# Patient Record
Sex: Female | Born: 1965 | Race: White | Hispanic: No | Marital: Married | Smoking: Smoker, current status unknown
Health system: Southern US, Community
[De-identification: ages and names within clinical notes are randomized; demographics above are authoritative.]

## PROBLEM LIST (undated history)

## (undated) DIAGNOSIS — R000 Tachycardia, unspecified: Secondary | ICD-10-CM

## (undated) DIAGNOSIS — N938 Other specified abnormal uterine and vaginal bleeding: Secondary | ICD-10-CM

## (undated) HISTORY — PX: BREAST SURGERY: SHX581

## (undated) HISTORY — DX: Tachycardia, unspecified: R00.0

## (undated) HISTORY — DX: Other specified abnormal uterine and vaginal bleeding: N93.8

## (undated) HISTORY — PX: REDUCTION MAMMAPLASTY: SUR839

---

## 2001-01-01 ENCOUNTER — Other Ambulatory Visit: Admission: RE | Admit: 2001-01-01 | Discharge: 2001-01-01 | Payer: Self-pay | Admitting: Family Medicine

## 2002-09-24 ENCOUNTER — Other Ambulatory Visit: Admission: RE | Admit: 2002-09-24 | Discharge: 2002-09-24 | Payer: Self-pay | Admitting: Family Medicine

## 2004-07-14 ENCOUNTER — Ambulatory Visit: Payer: Self-pay | Admitting: Family Medicine

## 2004-07-20 ENCOUNTER — Ambulatory Visit: Payer: Self-pay | Admitting: Family Medicine

## 2004-07-20 ENCOUNTER — Other Ambulatory Visit: Admission: RE | Admit: 2004-07-20 | Discharge: 2004-07-20 | Payer: Self-pay | Admitting: Family Medicine

## 2005-11-21 ENCOUNTER — Ambulatory Visit: Payer: Self-pay | Admitting: Family Medicine

## 2005-11-30 ENCOUNTER — Other Ambulatory Visit: Admission: RE | Admit: 2005-11-30 | Discharge: 2005-11-30 | Payer: Self-pay | Admitting: Family Medicine

## 2005-11-30 ENCOUNTER — Encounter: Payer: Self-pay | Admitting: Family Medicine

## 2005-11-30 ENCOUNTER — Ambulatory Visit: Payer: Self-pay | Admitting: Family Medicine

## 2005-12-14 ENCOUNTER — Ambulatory Visit: Payer: Self-pay | Admitting: Family Medicine

## 2005-12-25 ENCOUNTER — Encounter: Admission: RE | Admit: 2005-12-25 | Discharge: 2005-12-25 | Payer: Self-pay | Admitting: Family Medicine

## 2005-12-27 ENCOUNTER — Ambulatory Visit: Payer: Self-pay | Admitting: Family Medicine

## 2006-01-31 ENCOUNTER — Ambulatory Visit: Payer: Self-pay | Admitting: Family Medicine

## 2007-02-19 ENCOUNTER — Encounter: Admission: RE | Admit: 2007-02-19 | Discharge: 2007-02-19 | Payer: Self-pay | Admitting: Family Medicine

## 2007-04-11 ENCOUNTER — Ambulatory Visit: Payer: Self-pay | Admitting: Family Medicine

## 2007-04-11 LAB — CONVERTED CEMR LAB
AST: 86 units/L — ABNORMAL HIGH (ref 0–37)
Alkaline Phosphatase: 52 units/L (ref 39–117)
Basophils Relative: 0.2 % (ref 0.0–1.0)
Bilirubin Urine: NEGATIVE
Bilirubin, Direct: 0.1 mg/dL (ref 0.0–0.3)
Chloride: 105 meq/L (ref 96–112)
Cholesterol: 235 mg/dL (ref 0–200)
Creatinine, Ser: 0.7 mg/dL (ref 0.4–1.2)
Eosinophils Absolute: 0.2 10*3/uL (ref 0.0–0.6)
Glucose, Bld: 98 mg/dL (ref 70–99)
HCT: 36.5 % (ref 36.0–46.0)
Ketones, urine, test strip: NEGATIVE
Lymphocytes Relative: 33.4 % (ref 12.0–46.0)
MCHC: 34.8 g/dL (ref 30.0–36.0)
Neutro Abs: 3.2 10*3/uL (ref 1.4–7.7)
Neutrophils Relative %: 57.1 % (ref 43.0–77.0)
Platelets: 346 10*3/uL (ref 150–400)
Protein, U semiquant: NEGATIVE
RBC: 4.1 M/uL (ref 3.87–5.11)
RDW: 12 % (ref 11.5–14.6)
TSH: 2.03 microintl units/mL (ref 0.35–5.50)
Total Bilirubin: 0.5 mg/dL (ref 0.3–1.2)
Total Protein: 7 g/dL (ref 6.0–8.3)
Urobilinogen, UA: 0.2

## 2007-04-18 ENCOUNTER — Ambulatory Visit: Payer: Self-pay | Admitting: Family Medicine

## 2007-04-18 ENCOUNTER — Other Ambulatory Visit: Admission: RE | Admit: 2007-04-18 | Discharge: 2007-04-18 | Payer: Self-pay | Admitting: Family Medicine

## 2007-04-18 ENCOUNTER — Encounter: Payer: Self-pay | Admitting: Family Medicine

## 2007-04-18 DIAGNOSIS — N938 Other specified abnormal uterine and vaginal bleeding: Secondary | ICD-10-CM | POA: Insufficient documentation

## 2007-04-18 DIAGNOSIS — N949 Unspecified condition associated with female genital organs and menstrual cycle: Secondary | ICD-10-CM

## 2007-04-18 DIAGNOSIS — I472 Ventricular tachycardia, unspecified: Secondary | ICD-10-CM | POA: Insufficient documentation

## 2007-04-18 DIAGNOSIS — I4729 Other ventricular tachycardia: Secondary | ICD-10-CM | POA: Insufficient documentation

## 2007-04-18 DIAGNOSIS — H908 Mixed conductive and sensorineural hearing loss, unspecified: Secondary | ICD-10-CM | POA: Insufficient documentation

## 2007-04-30 ENCOUNTER — Encounter: Payer: Self-pay | Admitting: Family Medicine

## 2008-08-03 ENCOUNTER — Encounter: Admission: RE | Admit: 2008-08-03 | Discharge: 2008-08-03 | Payer: Self-pay | Admitting: Family Medicine

## 2009-09-20 ENCOUNTER — Ambulatory Visit: Payer: Self-pay | Admitting: Family Medicine

## 2009-09-20 LAB — CONVERTED CEMR LAB
ALT: 10 units/L (ref 0–35)
AST: 61 units/L — ABNORMAL HIGH (ref 0–37)
Alkaline Phosphatase: 57 units/L (ref 39–117)
Basophils Absolute: 0 10*3/uL (ref 0.0–0.1)
Basophils Relative: 0.5 % (ref 0.0–3.0)
Bilirubin, Direct: 0 mg/dL (ref 0.0–0.3)
CO2: 27 meq/L (ref 19–32)
Cholesterol: 214 mg/dL — ABNORMAL HIGH (ref 0–200)
Creatinine, Ser: 0.6 mg/dL (ref 0.4–1.2)
GFR calc non Af Amer: 112.09 mL/min (ref >60)
HCT: 38.4 % (ref 36.0–46.0)
Hemoglobin: 13.1 g/dL (ref 12.0–15.0)
Ketones, urine, test strip: NEGATIVE
Lymphocytes Relative: 29.1 % (ref 12.0–46.0)
Lymphs Abs: 1.9 10*3/uL (ref 0.7–4.0)
MCV: 91.2 fL (ref 78.0–100.0)
Monocytes Absolute: 0.5 10*3/uL (ref 0.1–1.0)
Neutro Abs: 4.1 10*3/uL (ref 1.4–7.7)
Neutrophils Relative %: 61.4 % (ref 43.0–77.0)
Nitrite: NEGATIVE
Sodium: 143 meq/L (ref 135–145)
Specific Gravity, Urine: 1.025
Total Bilirubin: 0.3 mg/dL (ref 0.3–1.2)
Total CHOL/HDL Ratio: 3
Triglycerides: 42 mg/dL (ref 0.0–149.0)
WBC: 6.6 10*3/uL (ref 4.5–10.5)
pH: 7

## 2009-09-30 ENCOUNTER — Other Ambulatory Visit: Admission: RE | Admit: 2009-09-30 | Discharge: 2009-09-30 | Payer: Self-pay | Admitting: Family Medicine

## 2009-09-30 ENCOUNTER — Ambulatory Visit: Payer: Self-pay | Admitting: Family Medicine

## 2009-09-30 DIAGNOSIS — I499 Cardiac arrhythmia, unspecified: Secondary | ICD-10-CM | POA: Insufficient documentation

## 2009-09-30 DIAGNOSIS — N390 Urinary tract infection, site not specified: Secondary | ICD-10-CM | POA: Insufficient documentation

## 2009-09-30 LAB — CONVERTED CEMR LAB: Pap Smear: NEGATIVE

## 2009-10-06 ENCOUNTER — Telehealth: Payer: Self-pay | Admitting: Family Medicine

## 2009-10-10 ENCOUNTER — Ambulatory Visit: Payer: Self-pay | Admitting: Internal Medicine

## 2009-11-23 ENCOUNTER — Telehealth: Payer: Self-pay | Admitting: Family Medicine

## 2009-12-01 ENCOUNTER — Ambulatory Visit: Payer: Self-pay | Admitting: Family Medicine

## 2009-12-01 DIAGNOSIS — I4949 Other premature depolarization: Secondary | ICD-10-CM | POA: Insufficient documentation

## 2010-02-28 ENCOUNTER — Ambulatory Visit: Payer: Self-pay | Admitting: Family Medicine

## 2010-02-28 DIAGNOSIS — J45909 Unspecified asthma, uncomplicated: Secondary | ICD-10-CM | POA: Insufficient documentation

## 2010-05-30 NOTE — Progress Notes (Signed)
Summary: PRE-AUTHORIZATION FOR MONITOR?  Phone Note Call from Patient   Caller: Patient  [phone] Reason for Call: Talk to Nurse Summary of Call: Pt called to speak with Fleet Contras, CMA... Marland KitchenPt adv that she was referred by Dr Tawanna Cooler to a Cardiologist and has been told that she will need to wear a holter-monitor for one week - ten days.... Pt states that [employer] told her that [facility] will be in charge of taking care of the pre-authorization  for the monitor through the pts insurance ([employer] through [facility])?   Pt wanted to make sure this is taken care of prior to her appt with Cardiology on October 10, 2009 at 9am.  Pt can be reached at [phone] with any questions or concerns.  Initial call taken by: Debbra Riding,  October 06, 2009 11:18 AM  Follow-up for Phone Call        I called pt's insurance: [employer] and they said that pre-cert is not required.  Faxed order to [facility] again to set up.  Follow-up by: Corky Mull,  October 06, 2009 2:27 PM

## 2010-05-30 NOTE — Procedures (Signed)
Summary: summary report  summary report   Imported By: Mirna Mires 11/14/2009 10:12:44  _____________________________________________________________________  External Attachment:    Type:   Image     Comment:   External Document

## 2010-05-30 NOTE — Assessment & Plan Note (Signed)
Summary: fu from cardiologist/holter monitor/njr   Vital Signs:  Patient profile:   45 year old female Menstrual status:  regular Weight:      165 pounds Temp:     99.1 degrees F oral BP sitting:   130 / 90  (left arm) Cuff size:   regular  Vitals Entered By: Kern Reap CMA Duncan Dull) (December 01, 2009 11:14 AM) CC: follow-up visit   CC:  follow-up visit.  History of Present Illness: Rachel Phillips is a 45 year old female, who comes in today for evaluation of cardiac arrhythmia.  She's been having episodes of skipping.  EKG was normal.  Holter monitor shows infrequent PVCs.  They actually look like PACs, but Dr. Graciela Husbands thinks there PVCs, but do not paired and she has no neurologic symptoms.  We discussed for his options, including going on a caffeine free diet, however, do not bother her that much, so she is content to do nothing at this juncture.  If, however, it gets worse.  They become more frequent and she is to try the caffeine free diet.  If that does not work then we will consider other medications.  Also, it she was supposed to call in May for her mammogram and has not done so.  Encouraged to call today to get set up for a screening mammogram  Allergies: No Known Drug Allergies  Review of Systems      See HPI  Physical Exam  General:  Well-developed,well-nourished,in no acute distress; alert,appropriate and cooperative throughout examination Heart:  Normal rate and regular rhythm. S1 and S2 normal without gallop, murmur, click, rub or other extra sounds.   Impression & Recommendations:  Problem # 1:  PREMATURE VENTRICULAR CONTRACTIONS (ICD-427.69) Assessment New  Her updated medication list for this problem includes:    Adult Aspirin Low Strength 81 Mg Tbdp (Aspirin) ..... Once daily  Complete Medication List: 1)  Verapamil Hcl Cr 240 Mg Cp24 (Verapamil hcl) .... Take 1 capsule by mouth once a day 2)  Adult Aspirin Low Strength 81 Mg Tbdp (Aspirin) .... Once daily 3)   Fexofenadine Hcl 180 Mg Tabs (Fexofenadine hcl) .... Once daily 4)  Norinyl 1+35 (28) 1-35 Mg-mcg Tabs (Norethindrone-eth estradiol) .... Once daily 5)  Phisohex 3 % Liqd (Hexachlorophene) .... As needed 6)  Verelan 240 Mg Xr24h-cap (Verapamil hcl) .Marland Kitchen.. 1 a day 7)  Septra Ds 800-160 Mg Tabs (Sulfamethoxazole-trimethoprim) .... 2 by mouth two times a day x 10 days 8)  Keflex 500 Mg Caps (Cephalexin) .... 2 by mouth two times a day as needed boils  Patient Instructions: 1)  is the PVCs are bothersome, go on a complete caffeine free diet.  If, however, the caffeine free diet, does not stop the skipping or it gets worse or he develops symptoms of lightheadedness, etc., then returned for follow-up

## 2010-05-30 NOTE — Assessment & Plan Note (Signed)
Summary: ST/CHEST CONGESTION/NJR   Vital Signs:  Patient profile:   45 year old female Menstrual status:  regular Weight:      169 pounds BMI:     28.22 O2 Sat:      99 % on Room air Temp:     98.0 degrees F oral Pulse rate:   94 / minute Pulse rhythm:   irregular Resp:     18 per minute BP sitting:   128 / 70  (right arm)  O2 Flow:  Room air CC: pt c/o sore throat, deep chest cough, chest congestion x 8 days   CC:  pt c/o sore throat, deep chest cough, and chest congestion x 8 days.  History of Present Illness: Rachel Phillips is a 45 year old female, nonsmoker, who comes in today for evaluation of a cough x 10 days.  She developed a cough 10 days ago.  No history of any viral symptoms.  She has had a history of allergic rhinitis in the past  Allergies (verified): 1)  Codeine  Past History:  Past medical, surgical, family and social histories (including risk factors) reviewed for relevance to current acute and chronic problems.  Past Medical History: Reviewed history from 01/13/2007 and no changes required. Tachycardia DUB Hearing Loss  Past Surgical History: Reviewed history from 01/13/2007 and no changes required. Bil Breast Reduction  Family History: Reviewed history from 01/13/2007 and no changes required. Family History of CAD Female 1st degree relative <50  Social History: Reviewed history from 01/13/2007 and no changes required. Occupation: Former Smoker Alcohol use-yes Drug use-no Married  Review of Systems      See HPI  Physical Exam  General:  Well-developed,well-nourished,in no acute distress; alert,appropriate and cooperative throughout examination Head:  Normocephalic and atraumatic without obvious abnormalities. No apparent alopecia or balding. Eyes:  No corneal or conjunctival inflammation noted. EOMI. Perrla. Funduscopic exam benign, without hemorrhages, exudates or papilledema. Vision grossly normal. Ears:  External ear exam shows no significant  lesions or deformities.  Otoscopic examination reveals clear canals, tympanic membranes are intact bilaterally without bulging, retraction, inflammation or discharge. Hearing is grossly normal bilaterally. Nose:  External nasal examination shows no deformity or inflammation. Nasal mucosa are pink and moist without lesions or exudates. Mouth:  Oral mucosa and oropharynx without lesions or exudates.  Teeth in good repair. Neck:  No deformities, masses, or tenderness noted. Chest Wall:  No deformities, masses, or tenderness noted. Lungs:  symmetrical breath sounds, late expiratory wheezing bilaterally   Problems:  Medical Problems Added: 1)  Dx of Asthma  (ICD-493.90)  Impression & Recommendations:  Problem # 1:  ASTHMA (ICD-493.90) Assessment New  Her updated medication list for this problem includes:    Prednisone 20 Mg Tabs (Prednisone) ..... Uad  Complete Medication List: 1)  Verapamil Hcl Cr 240 Mg Cp24 (Verapamil hcl) .... Take 1 capsule by mouth once a day 2)  Adult Aspirin Low Strength 81 Mg Tbdp (Aspirin) .... Once daily 3)  Fexofenadine Hcl 180 Mg Tabs (Fexofenadine hcl) .... Once daily 4)  Norinyl 1+35 (28) 1-35 Mg-mcg Tabs (Norethindrone-eth estradiol) .... Once daily 5)  Phisohex 3 % Liqd (Hexachlorophene) .... As needed 6)  Prednisone 20 Mg Tabs (Prednisone) .... Uad 7)  Hydromet 5-1.5 Mg/65ml Syrp (Hydrocodone-homatropine) .... 1/2 to 1 tsp at bedtime as needed  Patient Instructions: 1)  30 ounces of water daily. 2)  Hydromet  one half to 1 teaspoon at bedtime as needed for nighttime cough. 3)  Prednisone two tabs x 3  days, one x 3 days, a half x 3 days, then half a tablet Monday, Wednesday, Friday, for a two week taper, and return p.r.n. Prescriptions: HYDROMET 5-1.5 MG/5ML SYRP (HYDROCODONE-HOMATROPINE) 1/2 to 1 tsp at bedtime as needed  #4ox x 1   Entered and Authorized by:   Roderick Pee MD   Signed by:   Roderick Pee MD on 02/28/2010   Method used:   Printed  then faxed to ...       [facility]* (retail)       [address]       [place], [place]  [id number]       Ph: [phone]       Fax: [phone]   RxID:   [id number] PREDNISONE 20 MG TABS (PREDNISONE) UAD  #30 x 1   Entered and Authorized by:   Roderick Pee MD   Signed by:   Roderick Pee MD on 02/28/2010   Method used:   Electronically to        [facility]* (retail)       [address]       [place], [place]  [id number]       Ph: [phone]       Fax: [phone]   RxID:   [id number]    Orders Added: 1)  Est. Patient Level III [30160]

## 2010-05-30 NOTE — Progress Notes (Signed)
Summary: Holter results.  Phone Note Call from Patient   Caller: Patient Call For: Roderick Pee MD Summary of Call: Pt is calling to ask about a holter monitor result. [phone] Initial call taken by: Lynann Beaver CMA,  November 23, 2009 3:43 PM  Follow-up for Phone Call        Fleet Contras please call cardiology and have them fax as a report on her hold her Holter monitor.  Summary report Follow-up by: Roderick Pee MD,  November 28, 2009 8:09 AM  Additional Follow-up for Phone Call Additional follow up Details #1::        left message on machine for patient to schedule an office visit Additional Follow-up by: Kern Reap CMA Duncan Dull),  November 29, 2009 4:37 PM

## 2010-05-30 NOTE — Assessment & Plan Note (Signed)
Summary: CPX--PAP//CCM--will come at 3:15 per rachel//ccm   Vital Signs:  Patient profile:   45 year old female Menstrual status:  regular LMP:     09/19/2009 Height:      65 inches Weight:      167 pounds BMI:     27.89 Temp:     98.5 degrees F oral BP sitting:   120 / 80  (right arm)  Vitals Entered By: Kathrynn Speed CMA (September 30, 2009 3:14 PM) CC: cpx /  LMP (date): 09/19/2009     Menstrual Status regular Enter LMP: 09/19/2009   CC:  cpx / .  History of Present Illness: Rachel Phillips is a 45 year old female, married, nonsmoker............ she has retained her maiden name......... who comes in today for general physical examination and to talk about her palpitations.  Her overall health has been good.  She is on verapamil 240 mg daily because of the history of rapid heart rate.  She's been on verapamil now for 13 years however recently she is having more episodes of palpitations.  She denies any chest pain, shortness of breath, etc..  Review of systems negative  she's also noticed over the past couple weeks.  Her urine dark color, and frequency, although no burning.  Routine UA shows, red cells, and white cells, consistent with cystitis.  She also has boils that occur around her groin periodically.  It's worse during the summer, when it gets hot  Current Medications (verified): 1)  Verelan 240 Mg Xr24h-Cap (Verapamil Hcl) .Marland Kitchen.. 1 A Day  Allergies (verified): No Known Drug Allergies  Review of Systems      See HPI  Physical Exam  General:  Well-developed,well-nourished,in no acute distress; alert,appropriate and cooperative throughout examination Head:  Normocephalic and atraumatic without obvious abnormalities. No apparent alopecia or balding. Eyes:  No corneal or conjunctival inflammation noted. EOMI. Perrla. Funduscopic exam benign, without hemorrhages, exudates or papilledema. Vision grossly normal. Ears:  External ear exam shows no significant lesions or deformities.   Otoscopic examination reveals clear canals, tympanic membranes are intact bilaterally without bulging, retraction, inflammation or discharge. Hearing is grossly normal bilaterally. Nose:  External nasal examination shows no deformity or inflammation. Nasal mucosa are pink and moist without lesions or exudates. Mouth:  Oral mucosa and oropharynx without lesions or exudates.  Teeth in good repair. Neck:  No deformities, masses, or tenderness noted. Chest Wall:  No deformities, masses, or tenderness noted. Breasts:  scars from reduction mammoplasty, otherwise, normal exam Lungs:  Normal respiratory effort, chest expands symmetrically. Lungs are clear to auscultation, no crackles or wheezes. Heart:  Normal rate and regular rhythm. S1 and S2 normal without gallop, murmur, click, rub or other extra sounds. Abdomen:  Bowel sounds positive,abdomen soft and non-tender without masses, organomegaly or hernias noted. Rectal:  No external abnormalities noted. Normal sphincter tone. No rectal masses or tenderness. Genitalia:  Pelvic Exam:        External: normal female genitalia without lesions or masses        Vagina: normal without lesions or masses        Cervix: normal without lesions or masses        Adnexa: normal bimanual exam without masses or fullness        Uterus: normal by palpation        Pap smear: performed Msk:  No deformity or scoliosis noted of thoracic or lumbar spine.   Pulses:  R and L carotid,radial,femoral,dorsalis pedis and posterior tibial pulses are full and  equal bilaterally Extremities:  No clubbing, cyanosis, edema, or deformity noted with normal full range of motion of all joints.   Neurologic:  No cranial nerve deficits noted. Station and gait are normal. Plantar reflexes are down-going bilaterally. DTRs are symmetrical throughout. Sensory, motor and coordinative functions appear intact. Skin:  Intact without suspicious lesions or rashes Cervical Nodes:  No lymphadenopathy  noted Axillary Nodes:  No palpable lymphadenopathy Inguinal Nodes:  No significant adenopathy Psych:  Cognition and judgment appear intact. Alert and cooperative with normal attention span and concentration. No apparent delusions, illusions, hallucinations   Problems:  Medical Problems Added: 1)  Dx of Uti  (ICD-599.0) 2)  Dx of Routine General Medical Exam@[facility]  (ICD-V70.0) 3)  Dx of Cardiac Arrhythmia  (ICD-427.9)  Impression & Recommendations:  Problem # 1:  Preventive Health Care (ICD-V70.0) Assessment Unchanged  Problem # 2:  CARDIAC ARRHYTHMIA (ICD-427.9) Assessment: Deteriorated  Orders: Cardiology Referral (Cardiology) Prescription Created Electronically [phone]) EKG w/ Interpretation (93000)  Problem # 3:  UTI (ICD-599.0) Assessment: New  Her updated medication list for this problem includes:    Septra Ds 800-160 Mg Tabs (Sulfamethoxazole-trimethoprim) .Marland Kitchen... 2 by mouth two times a day x 10 days    Keflex 500 Mg Caps (Cephalexin) .Marland Kitchen... 2 by mouth two times a day as needed boils  Complete Medication List: 1)  Verelan 240 Mg Xr24h-cap (Verapamil hcl) .Marland Kitchen.. 1 a day 2)  Septra Ds 800-160 Mg Tabs (Sulfamethoxazole-trimethoprim) .... 2 by mouth two times a day x 10 days 3)  Keflex 500 Mg Caps (Cephalexin) .... 2 by mouth two times a day as needed boils  Patient Instructions: 1)  continue your current medications.  We will get she set up for an event monitor because of the episodes of rapid heart rate 2)  Please schedule a follow-up appointment in 1 year. 3)  Schedule your mammogram. 4)  Take an Aspirin every day. 5)  take Septra DS twice a day for 10 days.  Three.  UTI.  Also Keflex two tabs b.i.d. for a day or two when you have a flare of the boils Prescriptions: KEFLEX 500 MG CAPS (CEPHALEXIN) 2 by mouth two times a day as needed boils  #50 x 3   Entered and Authorized by:   Roderick Pee MD   Signed by:   Roderick Pee MD on 09/30/2009   Method used:    Electronically to        [facility]* (retail)       [address]       [place], [place]  [id number]       Ph: [phone]       Fax: [phone]   RxID:   [id number] SEPTRA DS 800-160 MG TABS (SULFAMETHOXAZOLE-TRIMETHOPRIM) 2 by mouth two times a day x 10 days  #20 x 0   Entered and Authorized by:   Roderick Pee MD   Signed by:   Roderick Pee MD on 09/30/2009   Method used:   Electronically to        [facility]* (retail)       [address]       [place], [place]  [id number]       Ph: [phone]       Fax: [phone]   RxID:   [id number] VERELAN 240 MG XR24H-CAP (VERAPAMIL HCL) 1 a day  #100 x 3   Entered and Authorized by:   Roderick Pee MD  Signed by:   Roderick Pee MD on 09/30/2009   Method used:   Electronically to        [facility]* (retail)       [address]       [place], [place]  [id number]       Ph: [phone]       Fax: [phone]   RxID:   [id number]

## 2011-03-16 ENCOUNTER — Other Ambulatory Visit: Payer: Self-pay | Admitting: Family Medicine

## 2011-03-16 DIAGNOSIS — Z1231 Encounter for screening mammogram for malignant neoplasm of breast: Secondary | ICD-10-CM

## 2011-04-12 ENCOUNTER — Ambulatory Visit
Admission: RE | Admit: 2011-04-12 | Discharge: 2011-04-12 | Disposition: A | Discharge status: Home / Self Care | Payer: PRIVATE HEALTH INSURANCE | Source: Ambulatory Visit | Attending: Family Medicine | Admitting: Family Medicine

## 2011-04-12 DIAGNOSIS — Z1231 Encounter for screening mammogram for malignant neoplasm of breast: Secondary | ICD-10-CM

## 2011-04-17 ENCOUNTER — Other Ambulatory Visit (INDEPENDENT_AMBULATORY_CARE_PROVIDER_SITE_OTHER): Payer: PRIVATE HEALTH INSURANCE

## 2011-04-17 DIAGNOSIS — Z0000 Encounter for general adult medical examination without abnormal findings: Secondary | ICD-10-CM

## 2011-04-17 LAB — BASIC METABOLIC PANEL WITH GFR
BUN: 13 mg/dL (ref 6–23)
CO2: 28 meq/L (ref 19–32)
Calcium: 9.5 mg/dL (ref 8.4–10.5)
Chloride: 107 meq/L (ref 96–112)
Creatinine, Ser: 0.8 mg/dL (ref 0.4–1.2)
GFR: 83.84 mL/min
Glucose, Bld: 90 mg/dL (ref 70–99)
Potassium: 4.5 meq/L (ref 3.5–5.1)
Sodium: 142 meq/L (ref 135–145)

## 2011-04-17 LAB — CBC WITH DIFFERENTIAL/PLATELET
Eosinophils Absolute: 0.1 10*3/uL (ref 0.0–0.7)
HCT: 35.9 % — ABNORMAL LOW (ref 36.0–46.0)
MCHC: 34.2 g/dL (ref 30.0–36.0)
MCV: 89.6 fl (ref 78.0–100.0)
Monocytes Absolute: 0.4 10*3/uL (ref 0.1–1.0)
Neutro Abs: 3.7 10*3/uL (ref 1.4–7.7)
RBC: 4 Mil/uL (ref 3.87–5.11)
WBC: 6.2 10*3/uL (ref 4.5–10.5)

## 2011-04-17 LAB — POCT URINALYSIS DIPSTICK
Bilirubin, UA: NEGATIVE
Blood, UA: NEGATIVE
Glucose, UA: NEGATIVE
Nitrite, UA: NEGATIVE
Spec Grav, UA: 1.025
Urobilinogen, UA: 0.2
pH, UA: 6.5

## 2011-04-17 LAB — HEPATIC FUNCTION PANEL
ALT: 17 U/L (ref 0–35)
AST: 73 U/L — ABNORMAL HIGH (ref 0–37)
Albumin: 4.1 g/dL (ref 3.5–5.2)
Alkaline Phosphatase: 61 U/L (ref 39–117)
Bilirubin, Direct: 0 mg/dL (ref 0.0–0.3)
Total Bilirubin: 0.4 mg/dL (ref 0.3–1.2)

## 2011-04-17 LAB — LIPID PANEL
Cholesterol: 231 mg/dL — ABNORMAL HIGH (ref 0–200)
HDL: 92.8 mg/dL
Total CHOL/HDL Ratio: 2
Triglycerides: 61 mg/dL (ref 0.0–149.0)
VLDL: 12.2 mg/dL (ref 0.0–40.0)

## 2011-04-17 LAB — LDL CHOLESTEROL, DIRECT: Direct LDL: 121.8 mg/dL

## 2011-04-17 LAB — TSH: TSH: 1.18 u[IU]/mL (ref 0.35–5.50)

## 2011-04-27 ENCOUNTER — Encounter: Payer: Self-pay | Admitting: Family Medicine

## 2011-04-30 ENCOUNTER — Ambulatory Visit (INDEPENDENT_AMBULATORY_CARE_PROVIDER_SITE_OTHER): Payer: PRIVATE HEALTH INSURANCE | Admitting: Family Medicine

## 2011-04-30 ENCOUNTER — Other Ambulatory Visit (HOSPITAL_COMMUNITY)
Admission: RE | Admit: 2011-04-30 | Discharge: 2011-04-30 | Disposition: A | Discharge status: Home / Self Care | Payer: PRIVATE HEALTH INSURANCE | Source: Ambulatory Visit | Attending: Family Medicine | Admitting: Family Medicine

## 2011-04-30 ENCOUNTER — Encounter: Payer: Self-pay | Admitting: Family Medicine

## 2011-04-30 DIAGNOSIS — J069 Acute upper respiratory infection, unspecified: Secondary | ICD-10-CM

## 2011-04-30 DIAGNOSIS — M25519 Pain in unspecified shoulder: Secondary | ICD-10-CM

## 2011-04-30 DIAGNOSIS — H908 Mixed conductive and sensorineural hearing loss, unspecified: Secondary | ICD-10-CM

## 2011-04-30 DIAGNOSIS — Z0000 Encounter for general adult medical examination without abnormal findings: Secondary | ICD-10-CM

## 2011-04-30 DIAGNOSIS — I4949 Other premature depolarization: Secondary | ICD-10-CM

## 2011-04-30 DIAGNOSIS — M25512 Pain in left shoulder: Secondary | ICD-10-CM

## 2011-04-30 DIAGNOSIS — I499 Cardiac arrhythmia, unspecified: Secondary | ICD-10-CM

## 2011-04-30 DIAGNOSIS — I4729 Other ventricular tachycardia: Secondary | ICD-10-CM

## 2011-04-30 DIAGNOSIS — N951 Menopausal and female climacteric states: Secondary | ICD-10-CM

## 2011-04-30 DIAGNOSIS — N959 Unspecified menopausal and perimenopausal disorder: Secondary | ICD-10-CM | POA: Insufficient documentation

## 2011-04-30 DIAGNOSIS — Z01419 Encounter for gynecological examination (general) (routine) without abnormal findings: Secondary | ICD-10-CM | POA: Insufficient documentation

## 2011-04-30 DIAGNOSIS — N949 Unspecified condition associated with female genital organs and menstrual cycle: Secondary | ICD-10-CM

## 2011-04-30 DIAGNOSIS — R413 Other amnesia: Secondary | ICD-10-CM | POA: Insufficient documentation

## 2011-04-30 DIAGNOSIS — N938 Other specified abnormal uterine and vaginal bleeding: Secondary | ICD-10-CM

## 2011-04-30 DIAGNOSIS — I472 Ventricular tachycardia: Secondary | ICD-10-CM

## 2011-04-30 MED ORDER — HYDROCODONE-HOMATROPINE 5-1.5 MG/5ML PO SYRP: ORAL_SOLUTION | ORAL | Status: DC

## 2011-04-30 MED ORDER — VERAPAMIL HCL 240 MG PO TBCR: 240.0000 mg | EXTENDED_RELEASE_TABLET | Freq: Every day | ORAL | Status: DC

## 2011-04-30 MED ORDER — FLUOXETINE HCL 10 MG PO CAPS: 10.0000 mg | ORAL_CAPSULE | Freq: Every day | ORAL | Status: DC

## 2011-04-30 NOTE — Progress Notes (Signed)
  Subjective:    Patient ID: Rachel Phillips, female    DOB: 01-10-1966, 45 y.o.   MRN: [id number]  HPI Rachel Phillips is a 45 year old, married female, G0, P0 nonsmoker, who comes in today for a general medical examination because of a history of a rapid heart rate, left shoulder pain and decreased range of motion for 3 months, and a viral infection with a cough, short-term memory changes for the past 6 to 8 months and difficulty with balance.  LMP 1115 in the beginning to be irregular.  She is also noticing some mood changes.  Recent mammogram normal.  She has no history of trauma to her left shoulder.  She is right-handed.  It's gotten stiff starting about 3 months ago.  She is unable to externally rotate her shoulder.  Now.  She had a viral syndrome with a congestion, sore throat, cough.  For the last 6 to 8, months she's noticed some short-term memory changes.  She had a history of hearing loss in her right ear, etiology unknown.  Now she is having trouble with her balance.     Review of Systems  Constitutional: Negative.   HENT: Negative.   Eyes: Negative.   Respiratory: Negative.   Cardiovascular: Negative.   Gastrointestinal: Negative.   Genitourinary: Negative.   Musculoskeletal: Negative.   Neurological: Negative.   Hematological: Negative.   Psychiatric/Behavioral: Negative.        Objective:   Physical Exam  Constitutional: She appears well-developed and well-nourished.  HENT:  Head: Normocephalic and atraumatic.  Right Ear: External ear normal.  Left Ear: External ear normal.  Nose: Nose normal.  Mouth/Throat: Oropharynx is clear and moist.  Eyes: EOM are normal. Pupils are equal, round, and reactive to light.  Neck: Normal range of motion. Neck supple. No thyromegaly present.  Cardiovascular: Normal rate, regular rhythm, normal heart sounds and intact distal pulses.  Exam reveals no gallop and no friction rub.   No murmur heard. Pulmonary/Chest: Effort normal and  breath sounds normal.  Abdominal: Soft. Bowel sounds are normal. She exhibits no distension and no mass. There is no tenderness. There is no rebound.  Genitourinary: Vagina normal and uterus normal. Guaiac negative stool. No vaginal discharge found.       Bilateral breast exam shows no palpable abnormality she does have scars from previous reduction mammoplasty  Musculoskeletal: Normal range of motion.       Right shoulder normal.  Full range of motion.  No pain, showed a very minimal range of motion unable to externally rotate the shoulder  Lymphadenopathy:    She has no cervical adenopathy.  Neurological: She is alert. She has normal reflexes. No cranial nerve deficit. She exhibits normal muscle tone. Coordination normal.  Skin: Skin is warm and dry.  Psychiatric: She has a normal mood and affect. Her behavior is normal. Judgment and thought content normal.          Assessment & Plan:  Healthy female.  Perimenopausal symptoms, consider Prozac.  Pain left shoulder orthopedic consult with Dr. Albertha Ghee.  Viral syndrome.  Treat symptomatically.  Short-term memory changes along with some issues of balance.  Neuro- consult with Denton Meek.

## 2011-04-30 NOTE — Patient Instructions (Signed)
Call Dr. Albertha Ghee at [facility] orthopedics for evaluation of your left shoulder.  We will do to set up a consult with Dr. Denton Meek for evaluation of the memory loss and the balance changes.  Hydromet p.r.n. For cough and cold.  Monitor mood changes, with the perimenopausal symptoms,,,,,,,,, data shows that low dose Prozac helps most women during this period of time

## 2011-05-10 ENCOUNTER — Encounter: Payer: Self-pay | Admitting: Family Medicine

## 2011-05-10 ENCOUNTER — Ambulatory Visit (INDEPENDENT_AMBULATORY_CARE_PROVIDER_SITE_OTHER): Payer: PRIVATE HEALTH INSURANCE | Admitting: Family Medicine

## 2011-05-10 VITALS — BP 120/84 | Temp 98.1°F | Wt 173.0 lb

## 2011-05-10 DIAGNOSIS — J45909 Unspecified asthma, uncomplicated: Secondary | ICD-10-CM

## 2011-05-10 MED ORDER — PREDNISONE 20 MG PO TABS: ORAL_TABLET | ORAL | Status: DC

## 2011-05-10 NOTE — Patient Instructions (Signed)
Take two tablets of prednisone daily for 3 days or until you feel a whole lot better and then begin to taper.  Drink lots of water.  Hydromet one half to 1 teaspoon at bedtime p.r.n. For nighttime cough.  Return p.r.n.

## 2011-05-10 NOTE — Progress Notes (Signed)
  Subjective:    Patient ID: Rachel Phillips, female    DOB: 11-08-1965, 46 y.o.   MRN: [id number]  HPI Rachel Phillips is a 46 year old, married female, nonsmoker, who comes in today for evaluation of her cough.  She's had a cold now for the past 11 days.  No fever.  Some sputum production.  No earache or sore throat.  She does have a history of allergic rhinitis.  She felt like she is beginning to wheeze again.  She has had a history of asthma   Review of Systems    General and pulmonary review of systems otherwise negative Objective:   Physical Exam  Well-developed well-nourished, female, no acute distress.  HEENT negative.  Neck was supple.  No adenopathy.  Lungs were clear      Assessment & Plan:  Bowel syndrome, with reactive airway disease and persistent cough.  Plan prednisone burst and taper.  Return p.r.n.

## 2011-05-16 ENCOUNTER — Ambulatory Visit: Payer: PRIVATE HEALTH INSURANCE | Admitting: Neurology

## 2011-05-31 ENCOUNTER — Ambulatory Visit (INDEPENDENT_AMBULATORY_CARE_PROVIDER_SITE_OTHER): Payer: PRIVATE HEALTH INSURANCE | Admitting: Neurology

## 2011-05-31 ENCOUNTER — Encounter: Payer: Self-pay | Admitting: Neurology

## 2011-05-31 VITALS — BP 116/78 | HR 76 | Ht 65.25 in | Wt 174.0 lb

## 2011-05-31 DIAGNOSIS — R413 Other amnesia: Secondary | ICD-10-CM

## 2011-05-31 NOTE — Progress Notes (Signed)
Dear Dr. Tawanna Cooler,  Thank you for having me see Rachel Phillips in consultation today at [facility] for her problem with memory and balance problems.  As you may recall, she is a 46 y.o. 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  She says that she is having problems remembering things that she did at work, having to check her lists to see if it was done.  She has had a great increase in responsibilities recently that correspond with her memory difficulties.  She also forgets conversations she had at home.  Her performance has not changed at work.  In addition, she describes balance problems when she turns her body.  She thinks these started after her sudden hearing loss several years ago, but it has gotten slightly worse.  No falls have been noted.  No numbness in her extremities.  Denies depression and anxiety.  She has recently been put on Prozac, which she thinks has helped her memory.  Past Medical History  Diagnosis Date  . Tachycardia   . DUB (dysfunctional uterine bleeding)   . Hearing loss     Past Surgical History  Procedure Date  . Breast surgery     b/l breast reduction    History   Social History  . Marital Status: Married    Spouse Name: N/A    Number of Children: N/A  . Years of Education: N/A   Social History Main Topics  . Smoking status: Former Smoker    Quit date: 05/30/1996  . Smokeless tobacco: Never Used  . Alcohol Use: Yes  . Drug Use: No  . Sexually Active: None   Other Topics Concern  . None   Social History Narrative  . None    Family History  Problem Relation Age of Onset  . Heart disease Other     Current Outpatient Prescriptions on File Prior to Visit  Medication Sig Dispense Refill  . aspirin 81 MG tablet Take 160 mg by mouth daily.        . fexofenadine (ALLEGRA) 180 MG tablet Take 180 mg by mouth  daily.        Marland Kitchen FLUoxetine (PROZAC) 10 MG capsule Take 1 capsule (10 mg total) by mouth daily.  100 capsule  3  . HYDROcodone-homatropine (HYCODAN) 5-1.5 MG/5ML syrup One half to 1 teaspoon at bedtime p.r.n. For cough and cold  120 mL  1  . verapamil (CALAN-SR) 240 MG CR tablet Take 1 tablet (240 mg total) by mouth at bedtime.  100 tablet  3    Allergies  Allergen Reactions  . Codeine     REACTION: N/V/D      ROS:  13 systems were reviewed and are notable for ringing in her ears.  All other review of systems are unremarkable.   Examination:  Filed Vitals:   05/31/11 0911  BP: 116/78  Pulse: 76  Height: 5' 5.25" (1.657 m)  Weight: 174 lb (78.926 kg)     In general, very well appearing women.  Cardiovascular: The patient has a regular rate and rhythm and no carotid bruits.  Fundoscopy:  Disks are flat. Vessel caliber within normal limits.  Mental status:   The patient is oriented to person, place and time. Recent and remote memory are intact. Attention span and concentration are normal. Language including repetition, naming, following commands are intact. Fund of  knowledge of current and historical events, as well as vocabulary are normal.  Cranial Nerves: Pupils are equally round and reactive to light. Visual fields full to confrontation. Extraocular movements are intact without nystagmus. Facial sensation and muscles of mastication are intact. Muscles of facial expression are symmetric. Hearing decreased on the right, weber lateralizes to left. Tongue protrusion, uvula, palate midline.  Shoulder shrug intact  Motor:  The patient has normal bulk and tone, no pronator drift.  There are no adventitious movements.  5/5 bilaterally.  Reflexes:   Biceps  Triceps Brachioradialis Knee Ankle  Right 2+  2+  2+   2+ 2+  Left  2+  2+  2+   2+ 2+  Toes down  Coordination:  Normal finger to nose.  No dysdiadokinesia.  Sensation is intact to temperature and vibration.  Position  sense intact.  Gait and Station are normal.  Tandem gait is intact.  Romberg is negative  Vestibular: - head thrust;  Fukuda, not frankly positive but does rotate to her right side   Impression/Recs: 1.  Memory Loss - I think this is from her increased responsibilities at work and stress.  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 2.  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  It is certainly mild.  Not sure why it is perceived as worse, but it is possible that with her increased stress she is noticing it more.  Certainly her exam is basically normal and I am not worried about a progressive condition here.  She can follow up with me on an as needed basis.   Thank you for having [place] see Rachel Phillips in consultation.  Feel free to contact me with any questions.  Lupita Raider Modesto Charon, MD [facility], [place] [address] [place], [place] [address] Phone: [phone] Fax: [phone].

## 2012-05-04 ENCOUNTER — Other Ambulatory Visit: Payer: Self-pay | Admitting: Family Medicine

## 2012-06-10 ENCOUNTER — Other Ambulatory Visit: Payer: Self-pay | Admitting: Family Medicine

## 2013-06-05 ENCOUNTER — Other Ambulatory Visit: Payer: Self-pay | Admitting: Family Medicine

## 2013-06-09 ENCOUNTER — Other Ambulatory Visit: Payer: Self-pay | Admitting: Family Medicine

## 2013-06-15 ENCOUNTER — Other Ambulatory Visit: Payer: Self-pay | Admitting: Family Medicine

## 2013-07-08 ENCOUNTER — Other Ambulatory Visit: Payer: Self-pay | Admitting: Family Medicine

## 2013-07-20 ENCOUNTER — Other Ambulatory Visit (INDEPENDENT_AMBULATORY_CARE_PROVIDER_SITE_OTHER): Payer: PRIVATE HEALTH INSURANCE

## 2013-07-20 DIAGNOSIS — Z0000 Encounter for general adult medical examination without abnormal findings: Secondary | ICD-10-CM

## 2013-07-20 LAB — CBC WITH DIFFERENTIAL/PLATELET
BASOS ABS: 0 10*3/uL (ref 0.0–0.1)
BASOS PCT: 0.6 % (ref 0.0–3.0)
EOS PCT: 2.2 % (ref 0.0–5.0)
Eosinophils Absolute: 0.1 10*3/uL (ref 0.0–0.7)
HCT: 37.9 % (ref 36.0–46.0)
HEMOGLOBIN: 12.8 g/dL (ref 12.0–15.0)
Lymphocytes Relative: 27.8 % (ref 12.0–46.0)
Lymphs Abs: 1.8 10*3/uL (ref 0.7–4.0)
MCHC: 33.7 g/dL (ref 30.0–36.0)
MCV: 89.7 fl (ref 78.0–100.0)
MONOS PCT: 6.5 % (ref 3.0–12.0)
Monocytes Absolute: 0.4 10*3/uL (ref 0.1–1.0)
Neutro Abs: 4.1 10*3/uL (ref 1.4–7.7)
Neutrophils Relative %: 62.9 % (ref 43.0–77.0)
Platelets: 273 10*3/uL (ref 150.0–400.0)
RBC: 4.23 Mil/uL (ref 3.87–5.11)
RDW: 13.6 % (ref 11.5–14.6)
WBC: 6.5 10*3/uL (ref 4.5–10.5)

## 2013-07-20 LAB — POCT URINALYSIS DIPSTICK
GLUCOSE UA: NEGATIVE
Nitrite, UA: NEGATIVE
Spec Grav, UA: >=1.03
UROBILINOGEN UA: 0.2
pH, UA: 5.5

## 2013-07-20 LAB — LIPID PANEL
Cholesterol: 254 mg/dL — ABNORMAL HIGH (ref 0–200)
HDL: 91.6 mg/dL (ref >39.00)
LDL CALC: 153 mg/dL — AB (ref 0–99)
Total CHOL/HDL Ratio: 3
Triglycerides: 47 mg/dL (ref 0.0–149.0)
VLDL: 9.4 mg/dL (ref 0.0–40.0)

## 2013-07-20 LAB — HEPATIC FUNCTION PANEL
ALBUMIN: 4.1 g/dL (ref 3.5–5.2)
ALT: 14 U/L (ref 0–35)
AST: 73 U/L — ABNORMAL HIGH (ref 0–37)
Alkaline Phosphatase: 57 U/L (ref 39–117)
Bilirubin, Direct: 0 mg/dL (ref 0.0–0.3)
TOTAL PROTEIN: 7.2 g/dL (ref 6.0–8.3)
Total Bilirubin: 0.6 mg/dL (ref 0.3–1.2)

## 2013-07-20 LAB — BASIC METABOLIC PANEL
BUN: 17 mg/dL (ref 6–23)
CALCIUM: 10 mg/dL (ref 8.4–10.5)
CHLORIDE: 103 meq/L (ref 96–112)
CO2: 27 meq/L (ref 19–32)
CREATININE: 0.7 mg/dL (ref 0.4–1.2)
GFR: 94.1 mL/min (ref >60.00)
Glucose, Bld: 87 mg/dL (ref 70–99)
POTASSIUM: 4.4 meq/L (ref 3.5–5.1)
Sodium: 137 mEq/L (ref 135–145)

## 2013-07-20 LAB — TSH: TSH: 1.57 u[IU]/mL (ref 0.35–5.50)

## 2013-08-03 ENCOUNTER — Ambulatory Visit (INDEPENDENT_AMBULATORY_CARE_PROVIDER_SITE_OTHER): Payer: PRIVATE HEALTH INSURANCE | Admitting: Family Medicine

## 2013-08-03 ENCOUNTER — Encounter: Payer: Self-pay | Admitting: Family Medicine

## 2013-08-03 VITALS — BP 120/80 | Temp 99.7°F | Ht 65.25 in | Wt 180.0 lb

## 2013-08-03 DIAGNOSIS — N959 Unspecified menopausal and perimenopausal disorder: Secondary | ICD-10-CM

## 2013-08-03 DIAGNOSIS — I4729 Other ventricular tachycardia: Secondary | ICD-10-CM

## 2013-08-03 DIAGNOSIS — I4949 Other premature depolarization: Secondary | ICD-10-CM

## 2013-08-03 DIAGNOSIS — J45909 Unspecified asthma, uncomplicated: Secondary | ICD-10-CM

## 2013-08-03 DIAGNOSIS — N951 Menopausal and female climacteric states: Secondary | ICD-10-CM

## 2013-08-03 DIAGNOSIS — I472 Ventricular tachycardia: Secondary | ICD-10-CM

## 2013-08-03 MED ORDER — VERAPAMIL HCL ER 240 MG PO TBCR: EXTENDED_RELEASE_TABLET | ORAL | Status: DC

## 2013-08-03 MED ORDER — FLUOXETINE HCL 10 MG PO CAPS: ORAL_CAPSULE | ORAL | Status: DC

## 2013-08-03 NOTE — Progress Notes (Signed)
   Subjective:    Patient ID: Rachel Phillips, female    DOB: 08/10/1965, 48 y.o.   MRN: [id number]  HPI Rachel Phillips is a 48 year old female who comes in today for general physical examination  She has a history of allergic rhinitis for which she takes Allegra when necessary  Has history of mild sleep dysfunction she takes Prozac 10 each bedtime  Pulses had a history of ventricular arrhythmias on breath of note time 40 mg daily  She does not get routine eye care refer to Dr. Bing Plume, does not get regular dental care referred to Dr. Gloriann Loan, does breast exam most months, mammogram 14 months ago, never had a colonoscopy.  LMP February 2015.......... one prior was in June.   Review of Systems  Constitutional: Negative.   HENT: Negative.   Eyes: Negative.   Respiratory: Negative.   Cardiovascular: Negative.   Gastrointestinal: Negative.   Genitourinary: Negative.   Musculoskeletal: Negative.   Neurological: Negative.   Psychiatric/Behavioral: Negative.       review of systems otherwise negative Objective:   Physical Exam  Nursing note and vitals reviewed. Constitutional: She appears well-developed and well-nourished.  HENT:  Head: Normocephalic and atraumatic.  Right Ear: External ear normal.  Left Ear: External ear normal.  Nose: Nose normal.  Mouth/Throat: Oropharynx is clear and moist.  Eyes: EOM are normal. Pupils are equal, round, and reactive to light.  Neck: Normal range of motion. Neck supple. No thyromegaly present.  Cardiovascular: Normal rate, regular rhythm, normal heart sounds and intact distal pulses.  Exam reveals no gallop and no friction rub.   No murmur heard. Pulmonary/Chest: Effort normal and breath sounds normal.  Abdominal: Soft. Bowel sounds are normal. She exhibits no distension and no mass. There is no tenderness. There is no rebound.  Genitourinary:  Bilateral breast exam shows scars from previous reduction mammoplasty and dense breast tissue. Recommend  3-D mammogram  Musculoskeletal: Normal range of motion.  Lymphadenopathy:    She has no cervical adenopathy.  Neurological: She is alert. She has normal reflexes. No cranial nerve deficit. She exhibits normal muscle tone. Coordination normal.  Skin: Skin is warm and dry.  Total body skin exam normal  Psychiatric: She has a normal mood and affect. Her behavior is normal. Judgment and thought content normal.          Assessment & Plan:  Healthy female  Allergic rhinitis continue Allegra  History of mild anxiety/depression continue Prozac 10 mg daily  History of ventricular arrhythmia continue verapamil 240 daily  Recommend eye care dental care and screening colonoscopy

## 2013-08-03 NOTE — Progress Notes (Signed)
Pre visit review using our clinic review tool, if applicable. No additional management support is needed unless otherwise documented below in the visit note. 

## 2013-08-03 NOTE — Patient Instructions (Signed)
Continue your current medications  Set up a mammogram and make it a 3-D  Dr. Calvert Cantor......... eye doctor  Dr. Gloriann Loan........... dentist  I put in a request for you to have a colonoscopy this year  Return in one year for general physical exam sooner if any problems

## 2013-09-03 ENCOUNTER — Encounter: Payer: Self-pay | Admitting: Family Medicine

## 2014-05-11 ENCOUNTER — Other Ambulatory Visit: Payer: Self-pay

## 2014-05-11 DIAGNOSIS — Z1231 Encounter for screening mammogram for malignant neoplasm of breast: Secondary | ICD-10-CM

## 2014-05-18 ENCOUNTER — Ambulatory Visit
Admission: RE | Admit: 2014-05-18 | Discharge: 2014-05-18 | Disposition: A | Discharge status: Home / Self Care | Payer: PRIVATE HEALTH INSURANCE | Source: Ambulatory Visit

## 2014-05-18 DIAGNOSIS — Z1231 Encounter for screening mammogram for malignant neoplasm of breast: Secondary | ICD-10-CM

## 2014-08-09 ENCOUNTER — Telehealth: Payer: Self-pay

## 2014-08-09 DIAGNOSIS — I4729 Other ventricular tachycardia: Secondary | ICD-10-CM

## 2014-08-09 DIAGNOSIS — I472 Ventricular tachycardia: Secondary | ICD-10-CM

## 2014-08-09 MED ORDER — VERAPAMIL HCL ER 240 MG PO TBCR
EXTENDED_RELEASE_TABLET | ORAL | Status: DC
Start: 2014-08-09 — End: 2014-09-14

## 2014-08-09 NOTE — Telephone Encounter (Signed)
[place] [place] refill request for VERAPAMIL 240MG  TAB. Last filled 08/03/2013 #100 with 3 refills.

## 2014-08-09 NOTE — Telephone Encounter (Signed)
Rx sent 

## 2014-09-14 ENCOUNTER — Other Ambulatory Visit: Payer: Self-pay | Admitting: Family Medicine

## 2014-09-15 ENCOUNTER — Other Ambulatory Visit: Payer: Self-pay | Admitting: Family Medicine

## 2014-10-19 ENCOUNTER — Ambulatory Visit (INDEPENDENT_AMBULATORY_CARE_PROVIDER_SITE_OTHER): Payer: PRIVATE HEALTH INSURANCE | Admitting: Adult Health

## 2014-10-19 ENCOUNTER — Encounter: Payer: Self-pay | Admitting: Adult Health

## 2014-10-19 VITALS — BP 120/84 | Temp 98.6°F | Ht 65.0 in | Wt 181.1 lb

## 2014-10-19 DIAGNOSIS — Z7189 Other specified counseling: Secondary | ICD-10-CM

## 2014-10-19 DIAGNOSIS — Z716 Tobacco abuse counseling: Secondary | ICD-10-CM | POA: Diagnosis not present

## 2014-10-19 DIAGNOSIS — Z7689 Persons encountering health services in other specified circumstances: Secondary | ICD-10-CM

## 2014-10-19 DIAGNOSIS — Z720 Tobacco use: Secondary | ICD-10-CM | POA: Diagnosis not present

## 2014-10-19 MED ORDER — BUPROPION HCL ER (XL) 150 MG PO TB24: 150.0000 mg | ORAL_TABLET | Freq: Every day | ORAL | Status: DC

## 2014-10-19 NOTE — Progress Notes (Signed)
Pre visit review using our clinic review tool, if applicable. No additional management support is needed unless otherwise documented below in the visit note. 

## 2014-10-19 NOTE — Patient Instructions (Addendum)
It was a pleasure meeting you today.   Take the Wellbutrin to help you quit smoking.   Follow up with me in a month and a half for a complete physical.If you need anything in the mean time, please let me know.    Smoking Cessation Quitting smoking is important to your health and has many advantages. However, it is not always easy to quit since nicotine is a very addictive drug. Oftentimes, people try 3 times or more before being able to quit. This document explains the best ways for you to prepare to quit smoking. Quitting takes hard work and a lot of effort, but you can do it. ADVANTAGES OF QUITTING SMOKING  You will live longer, feel better, and live better.  Your body will feel the impact of quitting smoking almost immediately.  Within 20 minutes, blood pressure decreases. Your pulse returns to its normal level.  After 8 hours, carbon monoxide levels in the blood return to normal. Your oxygen level increases.  After 24 hours, the chance of having a heart attack starts to decrease. Your breath, hair, and body stop smelling like smoke.  After 48 hours, damaged nerve endings begin to recover. Your sense of taste and smell improve.  After 72 hours, the body is virtually free of nicotine. Your bronchial tubes relax and breathing becomes easier.  After 2 to 12 weeks, lungs can hold more air. Exercise becomes easier and circulation improves.  The risk of having a heart attack, stroke, cancer, or lung disease is greatly reduced.  After 1 year, the risk of coronary heart disease is cut in half.  After 5 years, the risk of stroke falls to the same as a nonsmoker.  After 10 years, the risk of lung cancer is cut in half and the risk of other cancers decreases significantly.  After 15 years, the risk of coronary heart disease drops, usually to the level of a nonsmoker.  If you are pregnant, quitting smoking will improve your chances of having a healthy baby.  The people you live with,  especially any children, will be healthier.  You will have extra money to spend on things other than cigarettes. QUESTIONS TO THINK ABOUT BEFORE ATTEMPTING TO QUIT You may want to talk about your answers with your health care provider.  Why do you want to quit?  If you tried to quit in the past, what helped and what did not?  What will be the most difficult situations for you after you quit? How will you plan to handle them?  Who can help you through the tough times? Your family? Friends? A health care provider?  What pleasures do you get from smoking? What ways can you still get pleasure if you quit? Here are some questions to ask your health care provider:  How can you help me to be successful at quitting?  What medicine do you think would be best for me and how should I take it?  What should I do if I need more help?  What is smoking withdrawal like? How can I get information on withdrawal? GET READY  Set a quit date.  Change your environment by getting rid of all cigarettes, ashtrays, matches, and lighters in your home, car, or work. Do not let people smoke in your home.  Review your past attempts to quit. Think about what worked and what did not. GET SUPPORT AND ENCOURAGEMENT You have a better chance of being successful if you have help. You can get  support in many ways.  Tell your family, friends, and coworkers that you are going to quit and need their support. Ask them not to smoke around you.  Get individual, group, or telephone counseling and support. Programs are available at [employer] and health centers. Call your local health department for information about programs in your area.  Spiritual beliefs and practices may help some smokers quit.  Download a "quit meter" on your computer to keep track of quit statistics, such as how long you have gone without smoking, cigarettes not smoked, and money saved.  Get a self-help book about quitting smoking and staying  off tobacco. [place] yourself from urges to smoke. Talk to someone, go for a walk, or occupy your time with a task.  Change your normal routine. Take a different route to work. Drink tea instead of coffee. Eat breakfast in a different place.  Reduce your stress. Take a hot bath, exercise, or read a book.  Plan something enjoyable to do every day. Reward yourself for not smoking.  Explore interactive web-based programs that specialize in helping you quit. GET MEDICINE AND USE IT CORRECTLY Medicines can help you stop smoking and decrease the urge to smoke. Combining medicine with the above behavioral methods and support can greatly increase your chances of successfully quitting smoking.  Nicotine replacement therapy helps deliver nicotine to your body without the negative effects and risks of smoking. Nicotine replacement therapy includes nicotine gum, lozenges, inhalers, nasal sprays, and skin patches. Some may be available over-the-counter and others require a prescription.  Antidepressant medicine helps people abstain from smoking, but how this works is unknown. This medicine is available by prescription.  Nicotinic receptor partial agonist medicine simulates the effect of nicotine in your brain. This medicine is available by prescription. Ask your health care provider for advice about which medicines to use and how to use them based on your health history. Your health care provider will tell you what side effects to look out for if you choose to be on a medicine or therapy. Carefully read the information on the package. Do not use any other product containing nicotine while using a nicotine replacement product.  RELAPSE OR DIFFICULT SITUATIONS Most relapses occur within the first 3 months after quitting. Do not be discouraged if you start smoking again. Remember, most people try several times before finally quitting. You may have symptoms of withdrawal because  your body is used to nicotine. You may crave cigarettes, be irritable, feel very hungry, cough often, get headaches, or have difficulty concentrating. The withdrawal symptoms are only temporary. They are strongest when you first quit, but they will go away within 10-14 days. To reduce the chances of relapse, try to:  Avoid drinking alcohol. Drinking lowers your chances of successfully quitting.  Reduce the amount of caffeine you consume. Once you quit smoking, the amount of caffeine in your body increases and can give you symptoms, such as a rapid heartbeat, sweating, and anxiety.  Avoid smokers because they can make you want to smoke.  Do not let weight gain distract you. Many smokers will gain weight when they quit, usually less than 10 pounds. Eat a healthy diet and stay active. You can always lose the weight gained after you quit.  Find ways to improve your mood other than smoking. FOR MORE INFORMATION  www.smokefree.gov  Document Released: 04/10/2001 Document Revised: 08/31/2013 Document Reviewed: 07/26/2011 [facility] Patient Information 2015 [place], [place]. This information is not  intended to replace advice given to you by your health care provider. Make sure you discuss any questions you have with your health care provider.

## 2014-10-19 NOTE — Progress Notes (Signed)
HPI:  Rachel Phillips is here to establish care.  Last PCP and physical:April 2015 with MD Sherren Mocha  Has the following chronic problems that require follow up and concerns today:  Smoking Cessation  - She started smoking intermittantly 4-5 years ago and then became a daily habit 2-3 years ago.   She has no other complaints at this time.    ROS negative for unless reported above: fevers, chills,feeling poorly, unintentional weight loss, hearing or vision loss, chest pain, palpitations, leg claudication, struggling to breath,Not feeling congested in the chest, no orthopenia, no cough,no wheezing, normal appetite, no soft tissue swelling, no hemoptysis, melena, hematochezia, hematuria, falls, loc, si, or thoughts of self harm.  Immunizations: UTD Diet:Eats healthy  Exercise: Has periods where she exercises and then has periods where she does not exercise.  Colonoscopy:Needs Pap Smear:No abnormal paps Mammogram:2016 - Normal   Past Medical History  Diagnosis Date  . Tachycardia   . DUB (dysfunctional uterine bleeding)   . Hearing loss     Past Surgical History  Procedure Laterality Date  . Breast surgery      b/l breast reduction    Family History  Problem Relation Age of Onset  . Heart disease Other   . Heart attack Father     2-3   . Cancer Father     Liver>lung>brain    History   Social History  . Marital Status: Married    Spouse Name: N/A  . Number of Children: N/A  . Years of Education: N/A   Social History Main Topics  . Smoking status: Smoker, Current Status Unknown -- 0.50 packs/day    Types: Cigarettes    Last Attempt to Quit: 05/30/1996  . Smokeless tobacco: Never Used  . Alcohol Use: 0.0 oz/week    0 Standard drinks or equivalent per week     Comment: occasionally   . Drug Use: No  . Sexual Activity: Not on file   Other Topics Concern  . 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      Current outpatient prescriptions:  .  aspirin 81 MG tablet, Take 160 mg by mouth daily.  , Disp: , Rfl:  .  FLUoxetine (PROZAC) 10 MG capsule, TAKE 1 CAPSULE EVERY DAY., Disp: 90 capsule, Rfl: 0 .  fluticasone (FLONASE) 50 MCG/ACT nasal spray, Place 1 spray into both nostrils daily., Disp: , Rfl:  .  verapamil (CALAN-SR) 240 MG CR tablet, TAKE ONE TABLET AT BEDTIME., Disp: 30 tablet, Rfl: 0 .  buPROPion (WELLBUTRIN XL) 150 MG 24 hr tablet, Take 1 tablet (150 mg total) by mouth daily., Disp: 30 tablet, Rfl: 4  EXAM:  Filed Vitals:   10/19/14 1007  BP: 120/84  Temp: 98.6 F (37 C)    Body mass index is 30.14 kg/(m^2).  GENERAL: vitals reviewed and listed above, alert, oriented, appears well hydrated and in no acute distress.  HEENT: atraumatic, conjunttiva clear, no obvious abnormalities on inspection of external nose and ears. 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 Normal range of motion.   LUNGS: clear to auscultation bilaterally, no wheezes, rales or rhonchi, good air movement  CV: Regular rate and rhythm,  normal S1/S2, no audible murmurs, gallops, or rubs. No carotid bruit and no peripheral edema.   MS: moves all extremities without noticeable abnormality. No edema noted  Abd: soft/nontender/nondistended/normal bowel sounds   Skin: warm and dry, no rash   Extremities: No clubbing, cyanosis, or edema. Capillary refill is WNL. Pulses intact bilaterally in upper and lower extremities.   Neuro: CN II-XII intact, sensation and reflexes normal throughout, 5/5 muscle strength in bilateral upper and lower extremities.   PSYCH: pleasant and cooperative, no obvious depression or anxiety  ASSESSMENT AND PLAN:  1. Encounter to establish care - Follow up in July for CPE - Follow  up sooner if needed - Start exercising 30 minutes a day for 5 days a week - Incorporate heart healthy diet into life style.   2. Encounter for smoking cessation counseling - Has tried Wellbutrin in the past with success - buPROPion (WELLBUTRIN XL) 150 MG 24 hr tablet; Take 1 tablet (150 mg total) by mouth daily.  Dispense: 30 tablet; Refill: 4     Encounter to establish care  Encounter for smoking cessation counseling - Plan: buPROPion (WELLBUTRIN XL) 150 MG 24 hr tablet -We reviewed the PMH, PSH, FH, SH, Meds and Allergies. -We provided refills for any medications we will prescribe as needed. -We addressed current concerns per orders and patient instructions. -We have asked for records for pertinent exams, studies, vaccines and notes from previous providers. -We have advised patient to follow up per instructions below.   -Patient advised to return or notify a provider immediately if symptoms worsen or persist or new concerns arise.  Patient Instructions  It was a pleasure meeting you today.   Take the Wellbutrin to help you quit smoking.   Follow up with me in a month and a half for a complete physical.If you need anything in the mean time, please let me know.    Smoking Cessation Quitting smoking is important to your health and has many advantages. However, it is not always easy to quit since nicotine is a very addictive drug. Oftentimes, people try 3 times or more before being able to quit. This document explains the best ways for you to prepare to quit smoking. Quitting takes hard work and a lot of effort, but you can do it. ADVANTAGES OF QUITTING SMOKING  You will live longer, feel better, and live better.  Your body will feel the impact of quitting smoking almost immediately.  Within 20 minutes, blood pressure decreases. Your pulse returns to its normal level.  After 8 hours, carbon monoxide levels in the blood return to normal. Your oxygen level increases.  After 24  hours, the chance of having a heart attack starts to decrease. Your breath, hair, and body stop smelling like smoke.  After 48 hours, damaged nerve endings begin to recover. Your sense of taste and smell improve.  After 72 hours, the body is virtually free of nicotine. Your bronchial tubes relax and breathing becomes easier.  After 2 to 12 weeks, lungs can hold more air. Exercise becomes easier and circulation improves.  The risk of having a heart attack, stroke, cancer, or lung disease is greatly reduced.  After 1 year, the risk of coronary heart disease is cut in half.  After 5 years, the risk of stroke falls to the same as a nonsmoker.  After 10 years, the risk of lung cancer is cut in half and the risk of other cancers decreases significantly.  After 15 years, the risk of  coronary heart disease drops, usually to the level of a nonsmoker.  If you are pregnant, quitting smoking will improve your chances of having a healthy baby.  The people you live with, especially any children, will be healthier.  You will have extra money to spend on things other than cigarettes. QUESTIONS TO THINK ABOUT BEFORE ATTEMPTING TO QUIT You may want to talk about your answers with your health care provider.  Why do you want to quit?  If you tried to quit in the past, what helped and what did not?  What will be the most difficult situations for you after you quit? How will you plan to handle them?  Who can help you through the tough times? Your family? Friends? A health care provider?  What pleasures do you get from smoking? What ways can you still get pleasure if you quit? Here are some questions to ask your health care provider:  How can you help me to be successful at quitting?  What medicine do you think would be best for me and how should I take it?  What should I do if I need more help?  What is smoking withdrawal like? How can I get information on withdrawal? GET READY  Set a quit  date.  Change your environment by getting rid of all cigarettes, ashtrays, matches, and lighters in your home, car, or work. Do not let people smoke in your home.  Review your past attempts to quit. Think about what worked and what did not. GET SUPPORT AND ENCOURAGEMENT You have a better chance of being successful if you have help. You can get support in many ways.  Tell your family, friends, and coworkers that you are going to quit and need their support. Ask them not to smoke around you.  Get individual, group, or telephone counseling and support. Programs are available at [employer] and health centers. Call your local health department for information about programs in your area.  Spiritual beliefs and practices may help some smokers quit.  Download a "quit meter" on your computer to keep track of quit statistics, such as how long you have gone without smoking, cigarettes not smoked, and money saved.  Get a self-help book about quitting smoking and staying off tobacco. [place] yourself from urges to smoke. Talk to someone, go for a walk, or occupy your time with a task.  Change your normal routine. Take a different route to work. Drink tea instead of coffee. Eat breakfast in a different place.  Reduce your stress. Take a hot bath, exercise, or read a book.  Plan something enjoyable to do every day. Reward yourself for not smoking.  Explore interactive web-based programs that specialize in helping you quit. GET MEDICINE AND USE IT CORRECTLY Medicines can help you stop smoking and decrease the urge to smoke. Combining medicine with the above behavioral methods and support can greatly increase your chances of successfully quitting smoking.  Nicotine replacement therapy helps deliver nicotine to your body without the negative effects and risks of smoking. Nicotine replacement therapy includes nicotine gum, lozenges, inhalers, nasal sprays, and skin  patches. Some may be available over-the-counter and others require a prescription.  Antidepressant medicine helps people abstain from smoking, but how this works is unknown. This medicine is available by prescription.  Nicotinic receptor partial agonist medicine simulates the effect of nicotine in your brain. This medicine is available by prescription. Ask your health care provider for advice about  which medicines to use and how to use them based on your health history. Your health care provider will tell you what side effects to look out for if you choose to be on a medicine or therapy. Carefully read the information on the package. Do not use any other product containing nicotine while using a nicotine replacement product.  RELAPSE OR DIFFICULT SITUATIONS Most relapses occur within the first 3 months after quitting. Do not be discouraged if you start smoking again. Remember, most people try several times before finally quitting. You may have symptoms of withdrawal because your body is used to nicotine. You may crave cigarettes, be irritable, feel very hungry, cough often, get headaches, or have difficulty concentrating. The withdrawal symptoms are only temporary. They are strongest when you first quit, but they will go away within 10-14 days. To reduce the chances of relapse, try to:  Avoid drinking alcohol. Drinking lowers your chances of successfully quitting.  Reduce the amount of caffeine you consume. Once you quit smoking, the amount of caffeine in your body increases and can give you symptoms, such as a rapid heartbeat, sweating, and anxiety.  Avoid smokers because they can make you want to smoke.  Do not let weight gain distract you. Many smokers will gain weight when they quit, usually less than 10 pounds. Eat a healthy diet and stay active. You can always lose the weight gained after you quit.  Find ways to improve your mood other than smoking. FOR MORE INFORMATION  www.smokefree.gov   Document Released: 04/10/2001 Document Revised: 08/31/2013 Document Reviewed: 07/26/2011 [facility] Patient Information 2015 [place], [place]. This information is not intended to replace advice given to you by your health care provider. Make sure you discuss any questions you have with your health care provider.      [employer]

## 2014-10-20 ENCOUNTER — Other Ambulatory Visit: Payer: Self-pay | Admitting: Family Medicine

## 2014-12-16 ENCOUNTER — Other Ambulatory Visit: Payer: Self-pay | Admitting: Family Medicine

## 2015-01-27 ENCOUNTER — Other Ambulatory Visit: Payer: Self-pay | Admitting: Adult Health

## 2015-03-08 ENCOUNTER — Other Ambulatory Visit (INDEPENDENT_AMBULATORY_CARE_PROVIDER_SITE_OTHER): Payer: 59

## 2015-03-08 DIAGNOSIS — Z0000 Encounter for general adult medical examination without abnormal findings: Secondary | ICD-10-CM

## 2015-03-08 LAB — POCT URINALYSIS DIPSTICK
Bilirubin, UA: NEGATIVE
Glucose, UA: NEGATIVE
KETONES UA: NEGATIVE
Nitrite, UA: NEGATIVE
PROTEIN UA: NEGATIVE
Spec Grav, UA: 1.015
Urobilinogen, UA: 0.2
pH, UA: 8.5

## 2015-03-08 LAB — CBC WITH DIFFERENTIAL/PLATELET
BASOS PCT: 0.8 % (ref 0.0–3.0)
Basophils Absolute: 0 10*3/uL (ref 0.0–0.1)
EOS PCT: 3.4 % (ref 0.0–5.0)
Eosinophils Absolute: 0.2 10*3/uL (ref 0.0–0.7)
HCT: 40.1 % (ref 36.0–46.0)
HEMOGLOBIN: 13.6 g/dL (ref 12.0–15.0)
LYMPHS ABS: 1.9 10*3/uL (ref 0.7–4.0)
Lymphocytes Relative: 33.7 % (ref 12.0–46.0)
MCHC: 33.8 g/dL (ref 30.0–36.0)
MCV: 90.6 fl (ref 78.0–100.0)
MONO ABS: 0.4 10*3/uL (ref 0.1–1.0)
Monocytes Relative: 7.2 % (ref 3.0–12.0)
NEUTROS ABS: 3.1 10*3/uL (ref 1.4–7.7)
NEUTROS PCT: 54.9 % (ref 43.0–77.0)
PLATELETS: 259 10*3/uL (ref 150.0–400.0)
RBC: 4.43 Mil/uL (ref 3.87–5.11)
RDW: 13 % (ref 11.5–15.5)
WBC: 5.7 10*3/uL (ref 4.0–10.5)

## 2015-03-08 LAB — BASIC METABOLIC PANEL
BUN: 13 mg/dL (ref 6–23)
CO2: 30 meq/L (ref 19–32)
Calcium: 10.3 mg/dL (ref 8.4–10.5)
Chloride: 104 mEq/L (ref 96–112)
Creatinine, Ser: 0.92 mg/dL (ref 0.40–1.20)
GFR: 68.2 mL/min (ref >60.00)
GLUCOSE: 96 mg/dL (ref 70–99)
POTASSIUM: 5.3 meq/L — AB (ref 3.5–5.1)
SODIUM: 143 meq/L (ref 135–145)

## 2015-03-08 LAB — HEPATIC FUNCTION PANEL
ALT: 12 U/L (ref 0–35)
AST: 73 U/L — ABNORMAL HIGH (ref 0–37)
Albumin: 4.5 g/dL (ref 3.5–5.2)
Alkaline Phosphatase: 74 U/L (ref 39–117)
BILIRUBIN TOTAL: 0.5 mg/dL (ref 0.2–1.2)
Bilirubin, Direct: 0.1 mg/dL (ref 0.0–0.3)
Total Protein: 7.4 g/dL (ref 6.0–8.3)

## 2015-03-08 LAB — LIPID PANEL
CHOLESTEROL: 246 mg/dL — AB (ref 0–200)
HDL: 89.6 mg/dL (ref >39.00)
LDL CALC: 142 mg/dL — AB (ref 0–99)
NonHDL: 156.04
TRIGLYCERIDES: 68 mg/dL (ref 0.0–149.0)
Total CHOL/HDL Ratio: 3
VLDL: 13.6 mg/dL (ref 0.0–40.0)

## 2015-03-08 LAB — TSH: TSH: 2.19 u[IU]/mL (ref 0.35–4.50)

## 2015-03-15 ENCOUNTER — Ambulatory Visit (INDEPENDENT_AMBULATORY_CARE_PROVIDER_SITE_OTHER): Payer: 59 | Admitting: Adult Health

## 2015-03-15 ENCOUNTER — Encounter: Payer: Self-pay | Admitting: Adult Health

## 2015-03-15 VITALS — BP 124/84 | Temp 97.9°F | Ht 65.0 in | Wt 190.5 lb

## 2015-03-15 DIAGNOSIS — Z0000 Encounter for general adult medical examination without abnormal findings: Secondary | ICD-10-CM | POA: Diagnosis not present

## 2015-03-15 DIAGNOSIS — Z760 Encounter for issue of repeat prescription: Secondary | ICD-10-CM | POA: Diagnosis not present

## 2015-03-15 DIAGNOSIS — Z720 Tobacco use: Secondary | ICD-10-CM

## 2015-03-15 DIAGNOSIS — Z716 Tobacco abuse counseling: Secondary | ICD-10-CM

## 2015-03-15 DIAGNOSIS — D179 Benign lipomatous neoplasm, unspecified: Secondary | ICD-10-CM

## 2015-03-15 MED ORDER — VERAPAMIL HCL ER 240 MG PO TBCR: EXTENDED_RELEASE_TABLET | ORAL | Status: DC

## 2015-03-15 MED ORDER — FLUTICASONE PROPIONATE 50 MCG/ACT NA SUSP: 1.0000 | Freq: Every day | NASAL | Status: AC

## 2015-03-15 MED ORDER — FLUOXETINE HCL 10 MG PO CAPS: ORAL_CAPSULE | ORAL | Status: AC

## 2015-03-15 MED ORDER — BUPROPION HCL ER (XL) 150 MG PO TB24: 150.0000 mg | ORAL_TABLET | Freq: Every day | ORAL | Status: DC

## 2015-03-15 NOTE — Progress Notes (Signed)
Subjective:    Patient ID: Rachel Phillips, female    DOB: 1966/02/29, 49 y.o.   MRN: [id number]  HPI  Patient presents for yearly preventative medicine examination. She is a pleasant caucasian female who  has a past medical history of Tachycardia; DUB (dysfunctional uterine bleeding); and Hearing loss.   She has been smoke free since August 17th! She would like to stay on Wellbutrin till after the holidays.   All immunizations and health maintenance protocols were reviewed with the patient and needed orders were placed.  Appropriate screening laboratory values were reviewed with the patient including screening of hyperlipidemia, renal function and hepatic function. She understands that her cholesterol is high and is working on diet and exercise.   Medication reconciliation,  past medical history, social history, problem list and allergies were reviewed in detail with the patient  Goals were established with regard to weight loss, exercise, and  diet in compliance with medications  - She has had her yearly mammogram done. She is not due for a pap until next year.   Her only concern today is that of her lipomas. She has a large one on her left thigh and one on the back of the left knee. She endorses that the one on there thigh has the tendency to hurt and bruise. The one on the back of her left knee constantly itches. She would be interested in having them removed.    Review of Systems  Constitutional: Negative.   HENT: Negative.   Eyes: Negative.   Respiratory: Negative.   Cardiovascular: Negative.   Gastrointestinal: Negative.   Endocrine: Negative.   Genitourinary: Negative.   Musculoskeletal: Negative.   Skin: Negative.   Allergic/Immunologic: Negative.   Neurological: Negative.   Hematological: Negative.   Psychiatric/Behavioral: Negative.   All other systems reviewed and are negative.  Past Medical History  Diagnosis Date  . Tachycardia   . DUB (dysfunctional  uterine bleeding)   . Hearing loss     Social History   Social History  . Marital Status: Married    Spouse Name: N/A  . Number of Children: N/A  . Years of Education: N/A   Occupational History  . Not on file.   Social History Main Topics  . Smoking status: Smoker, Current Status Unknown -- 0.50 packs/day    Types: Cigarettes    Last Attempt to Quit: 05/30/1996  . Smokeless tobacco: Never Used  . Alcohol Use: 0.0 oz/week    0 Standard drinks or equivalent per week     Comment: occasionally   . Drug Use: No  . Sexual Activity: Not on file   Other Topics Concern  . 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     Past Surgical History  Procedure Laterality Date  . Breast surgery      b/l breast reduction    Family History  Problem Relation Age of Onset  . Heart disease Other   . Heart attack Father     2-3   . Cancer Father     Liver>lung>brain    Allergies  Allergen Reactions  . Codeine     REACTION: N/V/D    Current Outpatient Prescriptions on File Prior to Visit  Medication Sig Dispense Refill  . aspirin 81 MG tablet Take 160 mg by mouth daily.      Marland Kitchen  buPROPion (WELLBUTRIN XL) 150 MG 24 hr tablet Take 1 tablet (150 mg total) by mouth daily. 30 tablet 4  . FLUoxetine (PROZAC) 10 MG capsule TAKE 1 CAPSULE EVERY DAY. 90 capsule 0  . fluticasone (FLONASE) 50 MCG/ACT nasal spray Place 1 spray into both nostrils daily.    . verapamil (CALAN-SR) 240 MG CR tablet TAKE ONE TABLET AT BEDTIME. 30 tablet 5   No current facility-administered medications on file prior to visit.    BP 124/84 mmHg  Temp(Src) 97.9 F (36.6 C) (Oral)  Ht 5\' 5"  (1.651 m)  Wt 190 lb 8 oz (86.41 kg)  BMI 31.70 kg/m2  LMP 07/29/2013       Objective:   Physical Exam  Constitutional: She is oriented to person, place, and time. She appears well-developed and  well-nourished. No distress.  HENT:  Head: Normocephalic and atraumatic.  Right Ear: External ear normal.  Left Ear: External ear normal.  Nose: Nose normal.  Mouth/Throat: Oropharynx is clear and moist. No oropharyngeal exudate.  Eyes: Conjunctivae and EOM are normal. Pupils are equal, round, and reactive to light. Right eye exhibits no discharge. Left eye exhibits no discharge. No scleral icterus.  Neck: Normal range of motion. Neck supple. No JVD present. No tracheal deviation present. No thyromegaly present.  Cardiovascular: Normal rate, regular rhythm, normal heart sounds and intact distal pulses.  Exam reveals no gallop and no friction rub.   No murmur heard. Pulmonary/Chest: Effort normal and breath sounds normal. No respiratory distress. She has no wheezes. She has no rales. She exhibits no tenderness.  Abdominal: Soft. Bowel sounds are normal. She exhibits no distension and no mass. There is no tenderness. There is no rebound and no guarding.  Genitourinary:  Deferred   Musculoskeletal: Normal range of motion.  Lymphadenopathy:    She has no cervical adenopathy.  Neurological: She is alert and oriented to person, place, and time. She has normal reflexes. No cranial nerve deficit. Coordination normal.  Skin: Skin is warm and dry. No rash noted. She is not diaphoretic. No erythema. No pallor.  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 No bruising noted.   Psychiatric: She has a normal mood and affect. Her behavior is normal. Judgment and thought content normal.  Nursing note and vitals reviewed.      Assessment & Plan:  1. Routine general medical examination at a health care facility - Reviewed labs with patient - She has quit smoking but gained some weight. She is working on diet and exercise.  - Follow up in one year or sooner if needed  2. Encounter for smoking cessation counseling - buPROPion (WELLBUTRIN XL) 150 MG 24 hr  tablet; Take 1 tablet (150 mg total) by mouth daily.  Dispense: 90 tablet; Refill: 3  3. Lipoma - Ambulatory referral to General Surgery  4. Medication refill  - fluticasone (FLONASE) 50 MCG/ACT nasal spray; Place 1 spray into both nostrils daily.  Dispense: 16 g; Refill: 3 - verapamil (CALAN-SR) 240 MG CR tablet; TAKE ONE TABLET AT BEDTIME.  Dispense: 90 tablet; Refill: 3 - FLUoxetine (PROZAC) 10 MG capsule; TAKE 1 CAPSULE EVERY DAY.  Dispense: 90 capsule; Refill: 3 - buPROPion (WELLBUTRIN XL) 150 MG 24 hr tablet; Take 1 tablet (150 mg total) by mouth daily.  Dispense: 90 tablet; Refill: 3

## 2015-03-15 NOTE — Progress Notes (Signed)
Pre visit review using our clinic review tool, if applicable. No additional management support is needed unless otherwise documented below in the visit note. 

## 2015-05-20 ENCOUNTER — Ambulatory Visit: Payer: Self-pay | Admitting: Surgery

## 2015-05-20 NOTE — H&P (Signed)
Leather L. Domagala 05/20/2015 9:29 AM Location: [facility] Patient #: [id number] DOB: 1965/07/02 Married / Language: English / Race: White Female  History of Present Illness Marcello Moores A. Deronda Christian MD; 05/20/2015 12:46 PM) Patient words: Patient is sent at the request of CORY NAFZIGER for lipoma left anterior thigh and behind left knee. They have been present for many years. Causing more discomfort. Denies redness or drainage. Denies weakness of the limb or swelling.  The patient is a 50 year old female.   Other Problems Elbert Ewings, CMA; 05/20/2015 9:29 AM) Other disease, cancer, significant illness  Past Surgical History Elbert Ewings, CMA; 05/20/2015 9:29 AM) Breast Augmentation Bilateral.  Diagnostic Studies History Elbert Ewings, CMA; 05/20/2015 9:29 AM) Colonoscopy never Mammogram within last year Pap Smear 1-5 years ago  Allergies Elbert Ewings, CMA; 05/20/2015 9:30 AM) Codeine Phosphate *ANALGESICS - OPIOID*  Medication History Elbert Ewings, CMA; 05/20/2015 9:30 AM) Aspirin (81MG  Tablet DR, Oral) Active. BuPROPion HCl ER (SR) (150MG  Tablet ER 12HR, Oral) Active. FLUoxetine HCl (PMDD) (10MG  Capsule, Oral) Active. Fluticasone Propionate (50MCG/ACT Suspension, Nasal) Active. Verapamil HCl ER (240MG  Tablet ER, Oral) Active. Medications Reconciled  Social History Elbert Ewings, [place]; 05/20/2015 9:29 AM) Alcohol use Moderate alcohol use. Caffeine use Coffee, Tea. No drug use Tobacco use Former smoker.  Family History Elbert Ewings, [place]; 05/20/2015 9:29 AM) Alcohol Abuse Father. Heart Disease Father. Heart disease in female family member before age 52  Pregnancy / Birth History Elbert Ewings, [place]; 05/20/2015 9:29 AM) Age at menarche 61 years. Age of menopause 49-55 Contraceptive History Oral contraceptives. Gravida 0 Irregular periods Para 0     Review of Systems Elbert Ewings CMA; 05/20/2015 9:29 AM) General Present- Weight Gain. Not  Present- Appetite Loss, Chills, Fatigue, Fever, Night Sweats and Weight Loss. Skin Not Present- Change in Wart/Mole, Dryness, Hives, Jaundice, New Lesions, Non-Healing Wounds, Rash and Ulcer. HEENT Present- Seasonal Allergies. Not Present- Earache, Hearing Loss, Hoarseness, Nose Bleed, Oral Ulcers, Ringing in the Ears, Sinus Pain, Sore Throat, Visual Disturbances, Wears glasses/contact lenses and Yellow Eyes. Respiratory Not Present- Bloody sputum, Chronic Cough, Difficulty Breathing, Snoring and Wheezing. Breast Not Present- Breast Mass, Breast Pain, Nipple Discharge and Skin Changes. Cardiovascular Not Present- Chest Pain, Difficulty Breathing Lying Down, Leg Cramps, Palpitations, Rapid Heart Rate, Shortness of Breath and Swelling of Extremities. Gastrointestinal Not Present- Abdominal Pain, Bloating, Bloody Stool, Change in Bowel Habits, Chronic diarrhea, Constipation, Difficulty Swallowing, Excessive gas, Gets full quickly at meals, Hemorrhoids, Indigestion, Nausea, Rectal Pain and Vomiting. Female Genitourinary Not Present- Frequency, Nocturia, Painful Urination, Pelvic Pain and Urgency. Musculoskeletal Not Present- Back Pain, Joint Pain, Joint Stiffness, Muscle Pain, Muscle Weakness and Swelling of Extremities. Neurological Present- Decreased Memory and Trouble walking. Not Present- Fainting, Headaches, Numbness, Seizures, Tingling, Tremor and Weakness. Psychiatric Not Present- Anxiety, Bipolar, Change in Sleep Pattern, Depression, Fearful and Frequent crying. Endocrine Present- Hot flashes. Not Present- Cold Intolerance, Excessive Hunger, Hair Changes, Heat Intolerance and New Diabetes. Hematology Not Present- Easy Bruising, Excessive bleeding, Gland problems, HIV and Persistent Infections.  Vitals Elbert Ewings CMA; 05/20/2015 9:31 AM) 05/20/2015 9:30 AM Weight: 185 lb Height: 65in Body Surface Area: 1.91 m Body Mass Index: 30.79 kg/m  Temp.: 97.12F(Temporal)  Pulse: 90 (Regular)   BP: 126/76 (Sitting, Left Arm, Standard)      Physical Exam (Gyanna Jarema A. Shontelle Muska MD; 05/20/2015 12:47 PM)  General Mental Status-Alert. General Appearance-Consistent with stated age. Hydration-Well hydrated. Voice-Normal.  Integumentary Note: Over left anterior thigh is a 3 x 5 cm mobile lipoma. Over  the popliteal fossa left leg is a 2 cm mobile mass consistent with lipoma. No evidence of edema.  Eye Eyeball - Bilateral-Extraocular movements intact. Sclera/Conjunctiva - Bilateral-No scleral icterus.  Chest and Lung Exam Chest and lung exam reveals -quiet, even and easy respiratory effort with no use of accessory muscles and on auscultation, normal breath sounds, no adventitious sounds and normal vocal resonance. Inspection Chest Wall - Normal. Back - normal.  Cardiovascular Cardiovascular examination reveals -normal heart sounds, regular rate and rhythm with no murmurs and normal pedal pulses bilaterally.  Musculoskeletal Normal Exam - Left-Upper Extremity Strength Normal and Lower Extremity Strength Normal. Normal Exam - Right-Upper Extremity Strength Normal and Lower Extremity Strength Normal.  Lymphatic Femoral & Inguinal  Generalized Femoral & Inguinal Lymphatics: Bilateral - Description - Normal. Tenderness - Non Tender.    Assessment & Plan (Mitali Shenefield A. 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 Risk of bleeding, infection, nerve injury leading to disability the left lower extremity, foot drop, major blood vessel injury causing low blood flow possible surgical repair that, recurrence, numbness, chronic pain, DVT, death, and other related compilations to anesthesia and surgery discussed.  Current Plans You are being scheduled for surgery - Our schedulers will call you.  You should hear from our office's scheduling department within 5 working days about the  location, date, and time of surgery. We try to make accommodations for patient's preferences in scheduling surgery, but sometimes the OR schedule or the surgeon's schedule prevents [place] from making those accommodations.  If you have not heard from our office [phone]) in 5 working days, call the office and ask for your surgeon's nurse.  If you have other questions about your diagnosis, plan, or surgery, call the office and ask for your surgeon's nurse.  The anatomy and the physiology was discussed. The pathophysiology and natural history of the disease was discussed. Options were discussed and recommendations were made. Technique, risks, benefits, & alternatives were discussed. Risks such as stroke, heart attack, bleeding, indection, death, and other risks discussed. Questions answered. The patient agrees to proceed. The pathophysiology of skin & subcutaneous masses was discussed. Natural history risks without surgery were discussed. I recommended surgery to remove the mass. I explained the technique of removal with use of local anesthesia & possible need for more aggressive sedation/anesthesia for patient comfort.  Risks such as bleeding, infection, wound breakdown, heart attack, death, and other risks were discussed. I noted a good likelihood this will help address the problem. Possibility that this will not correct all symptoms was explained. Possibility of regrowth/recurrence of the mass was discussed. We will work to minimize complications. Questions were answered. The patient expresses understanding & wishes to proceed with surgery.  Pt Education - Benign Tumors: discussed with patient and provided information.

## 2015-07-05 ENCOUNTER — Other Ambulatory Visit: Payer: Self-pay

## 2015-07-05 DIAGNOSIS — Z1231 Encounter for screening mammogram for malignant neoplasm of breast: Secondary | ICD-10-CM

## 2015-07-14 ENCOUNTER — Ambulatory Visit
Admission: RE | Admit: 2015-07-14 | Discharge: 2015-07-14 | Disposition: A | Discharge status: Home / Self Care | Payer: BLUE CROSS/BLUE SHIELD | Source: Ambulatory Visit

## 2015-07-14 DIAGNOSIS — Z1231 Encounter for screening mammogram for malignant neoplasm of breast: Secondary | ICD-10-CM

## 2016-01-31 ENCOUNTER — Other Ambulatory Visit: Payer: Self-pay | Admitting: Adult Health

## 2016-01-31 DIAGNOSIS — Z760 Encounter for issue of repeat prescription: Secondary | ICD-10-CM

## 2016-01-31 NOTE — Telephone Encounter (Signed)
Ok to refill for 90 days plus 1 refill

## 2016-01-31 NOTE — Telephone Encounter (Signed)
Ok to refill 

## 2017-01-18 ENCOUNTER — Encounter: Payer: Self-pay | Admitting: Adult Health

## 2017-04-01 ENCOUNTER — Other Ambulatory Visit: Payer: Self-pay | Admitting: Family Medicine

## 2017-04-01 DIAGNOSIS — Z1231 Encounter for screening mammogram for malignant neoplasm of breast: Secondary | ICD-10-CM

## 2017-05-02 ENCOUNTER — Ambulatory Visit
Admission: RE | Admit: 2017-05-02 | Discharge: 2017-05-02 | Disposition: A | Discharge status: Home / Self Care | Payer: BLUE CROSS/BLUE SHIELD | Source: Ambulatory Visit | Attending: Family Medicine | Admitting: Family Medicine

## 2017-05-02 DIAGNOSIS — Z1231 Encounter for screening mammogram for malignant neoplasm of breast: Secondary | ICD-10-CM

## 2019-03-04 ENCOUNTER — Other Ambulatory Visit: Payer: Self-pay | Admitting: Adult Health

## 2019-03-04 DIAGNOSIS — Z1231 Encounter for screening mammogram for malignant neoplasm of breast: Secondary | ICD-10-CM

## 2019-03-05 ENCOUNTER — Ambulatory Visit
Admission: RE | Admit: 2019-03-05 | Discharge: 2019-03-05 | Disposition: A | Discharge status: Home / Self Care | Payer: 59 | Source: Ambulatory Visit | Attending: Adult Health | Admitting: Adult Health

## 2019-03-05 ENCOUNTER — Other Ambulatory Visit: Payer: Self-pay

## 2019-03-05 DIAGNOSIS — Z1231 Encounter for screening mammogram for malignant neoplasm of breast: Secondary | ICD-10-CM

## 2019-07-15 ENCOUNTER — Other Ambulatory Visit: Payer: 59

## 2020-06-20 ENCOUNTER — Ambulatory Visit: Payer: Self-pay

## 2020-06-20 ENCOUNTER — Other Ambulatory Visit: Payer: Self-pay

## 2020-06-20 ENCOUNTER — Ambulatory Visit
Admission: EM | Admit: 2020-06-20 | Discharge: 2020-06-20 | Disposition: A | Discharge status: Home / Self Care | Payer: 59

## 2020-06-20 ENCOUNTER — Ambulatory Visit (HOSPITAL_COMMUNITY): Payer: Self-pay

## 2020-06-20 DIAGNOSIS — H5711 Ocular pain, right eye: Secondary | ICD-10-CM | POA: Diagnosis not present

## 2020-06-20 NOTE — ED Triage Notes (Addendum)
Pt is present today with a HA, eye pain, and nausea. Pt states that she cannot see clear out her right eye clearly and has dizziness. Pt right eye looks red and irritated. Pt states that her sx started yesterday around 1pm.

## 2020-06-20 NOTE — ED Provider Notes (Signed)
[facility]    CSN: [id number] Arrival date & time: 06/20/20  2505      History   Chief Complaint Chief Complaint  Patient presents with  . Headache  . Eye Pain  . Nausea    HPI Rachel Phillips is a 55 y.o. female.   HPI   55 year old female here for evaluation of right eye pain, headache, and nausea.  Patient reports that her eye pain was sudden and happened around 1 PM yesterday afternoon and was soon followed behind by a right-sided headache.  Patient has been having waves of nausea, light sensitivity, and she reports that she is having trouble seeing out of her right eye and that it has a hazy film.  Patient does not remember any injury.  Past Medical History:  Diagnosis Date  . DUB (dysfunctional uterine bleeding)   . Hearing loss   . Tachycardia     Patient Active Problem List   Diagnosis Date Noted  . Memory change 04/30/2011  . Left shoulder pain 04/30/2011  . Premenopausal patient 04/30/2011  . ASTHMA 02/28/2010  . PREMATURE VENTRICULAR CONTRACTIONS 12/01/2009  . CARDIAC ARRHYTHMIA 09/30/2009  . 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 VENTRICULAR TACHYCARDIA 04/18/2007  . 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 BREAST SURGERY     b/l breast reduction  . REDUCTION MAMMAPLASTY Bilateral 20+ yrs ago    OB History   No obstetric history on file.      Home Medications    Prior to Admission medications   Medication Sig Start Date End Date Taking? Authorizing Provider  ascorbic acid (VITAMIN C) 100 MG tablet Take by mouth.    [provider]  aspirin 81 MG tablet Take 160 mg by mouth daily.      [provider]  cyanocobalamin 100 MCG tablet Take by mouth.    [provider]  ferrous sulfate 325 (65 FE) MG tablet Take by mouth.    [provider]  Fexofenadine HCl (ALLEGRA PO) Take by mouth.    [provider]  FLUoxetine  (PROZAC) 10 MG capsule TAKE 1 CAPSULE EVERY DAY. 03/15/15   Nafziger, Tommi Rumps, NP  fluticasone (FLONASE) 50 MCG/ACT nasal spray Place 1 spray into both nostrils daily. 03/15/15   Dorothyann Peng, NP  Ginkgo Biloba Extract 60 MG CAPS Take by mouth.    [provider]  Multiple Vitamin (THERA) TABS Take 1 tablet by mouth daily.    [provider]  verapamil (CALAN-SR) 240 MG CR tablet TAKE ONE TABLET AT BEDTIME. 01/31/16   Nafziger, Tommi Rumps, NP  buPROPion (WELLBUTRIN XL) 150 MG 24 hr tablet Take 1 tablet (150 mg total) by mouth daily. 03/15/15 06/20/20  Dorothyann Peng, NP    Family History Family History  Problem Relation Age of Onset  . Heart disease Other   . Heart attack Father        2-3   . Cancer Father        Liver>lung>brain    Social History Social History   Tobacco Use  . Smoking status: Smoker, Current Status Unknown    Packs/day: 0.50    Types: Cigarettes    Last attempt to quit: 05/30/1996    Years since quitting: 24.0  . Smokeless tobacco: Never Used  Substance Use Topics  . Alcohol use: Yes    Alcohol/week: 0.0 standard drinks    Comment: occasionally   . Drug use: No  Allergies   Codeine   Review of Systems Review of Systems  Constitutional: Negative for activity change.  Eyes: Positive for photophobia, pain, redness and visual disturbance. Negative for discharge.  Gastrointestinal: Positive for nausea.  Neurological: Positive for headaches. Negative for dizziness and facial asymmetry.  Hematological: Negative.   Psychiatric/Behavioral: Negative.      Physical Exam Triage Vital Signs ED Triage Vitals  Enc Vitals Group     BP 06/20/20 0901 (!) 170/96     Pulse Rate 06/20/20 0901 61     Resp 06/20/20 0901 19     Temp 06/20/20 0901 98.1 F (36.7 C)     Temp Source 06/20/20 0901 Oral     SpO2 06/20/20 0901 100 %     Weight --      Height --      Head Circumference --      Peak Flow --      Pain Score 06/20/20 0858 8     Pain Loc  --      Pain Edu? --      Excl. in [place]? --    No data found.  Updated Vital Signs BP (!) 170/96 (BP Location: Right Arm)   Pulse 61   Temp 98.1 F (36.7 C) (Oral)   Resp 19   LMP 07/29/2013   SpO2 100%   Visual Acuity Right Eye Distance:   Left Eye Distance:   Bilateral Distance:    Right Eye Near:   Left Eye Near:    Bilateral Near:     Physical Exam Vitals and nursing note reviewed.  Constitutional:      Appearance: Normal appearance. She is obese. She is ill-appearing.  HENT:     Head: Normocephalic and atraumatic.  Eyes:     Conjunctiva/sclera: Conjunctivae normal.  Cardiovascular:     Rate and Rhythm: Normal rate and regular rhythm.     Pulses: Normal pulses.     Heart sounds: Normal heart sounds. No murmur heard. No gallop.   Pulmonary:     Effort: Pulmonary effort is normal.     Breath sounds: Normal breath sounds. No wheezing, rhonchi or rales.  Skin:    General: Skin is warm and dry.     Capillary Refill: Capillary refill takes less than 2 seconds.     Findings: No erythema or rash.  Neurological:     General: No focal deficit present.     Mental Status: She is alert and oriented to person, place, and time.  Psychiatric:        Mood and Affect: Mood normal.        Behavior: Behavior normal.        Thought Content: Thought content normal.        Judgment: Judgment normal.      UC Treatments / Results  Labs (all labs ordered are listed, but only abnormal results are displayed) Labs Reviewed - No data to display  EKG   Radiology No results found.  Procedures Procedures (including critical care time)  Medications Ordered in UC Medications - No data to display  Initial Impression / Assessment and Plan / UC Course  I have reviewed the triage vital signs and the nursing notes.  Pertinent labs & imaging results that were available during my care of the patient were reviewed by me and considered in my medical decision making (see chart for  details).   Patient is a very pleasant 55 year old female here for evaluation of right eye pain,  headache, nausea.  Patient reports that her eye pain was of sudden onset yesterday around 1 PM followed quickly by right-sided headache and then she developed nausea and has had nausea off and on.  Patient is light sensitive.  Physical exam reveals an injected right eye.  The pupil does not constrict with direct light and is approximately 6 mm.  Patient has pain with lateral and inferior eye movements of her right eye.  Left eye is normal.  Concern for acute angle-closure glaucoma, especially given patient's hazy vision, and requires immediate ophthalmology evaluation.  [facility] contacted in [place] and they can see the patient immediately.  Patient discharged and directed to go immediately to [facility].  Patient's husband is driving.  Patient provided address of the clinic.   Final Clinical Impressions(s) / UC Diagnoses   Final diagnoses:  Eye pain, right     Discharge Instructions     As we discussed I am concerned that you have developed what is called acute angle-closure glaucoma in your right eye and you need to see ophthalmology immediately.  Please go to the [facility] in [place] at the address provided for immediate evaluation.    ED Prescriptions    None     PDMP not reviewed this encounter.   Margarette Canada, NP 06/20/20 0930

## 2020-06-20 NOTE — Discharge Instructions (Addendum)
As we discussed I am concerned that you have developed what is called acute angle-closure glaucoma in your right eye and you need to see ophthalmology immediately.  Please go to the [facility] in [place] at the address provided for immediate evaluation.

## 2021-01-27 ENCOUNTER — Other Ambulatory Visit: Payer: Self-pay | Admitting: Physician Assistant

## 2021-01-27 DIAGNOSIS — Z1231 Encounter for screening mammogram for malignant neoplasm of breast: Secondary | ICD-10-CM

## 2021-02-24 ENCOUNTER — Other Ambulatory Visit: Payer: Self-pay

## 2021-02-24 ENCOUNTER — Ambulatory Visit
Admission: RE | Admit: 2021-02-24 | Discharge: 2021-02-24 | Disposition: A | Discharge status: Home / Self Care | Payer: 59 | Source: Ambulatory Visit | Attending: Physician Assistant | Admitting: Physician Assistant

## 2021-02-24 DIAGNOSIS — Z1231 Encounter for screening mammogram for malignant neoplasm of breast: Secondary | ICD-10-CM

## 2021-12-03 IMAGING — MG MM DIGITAL SCREENING BILAT W/ TOMO AND CAD
8 series · 8 of 24 positions shown · non-contrast
Comparison: Previous exam(s).

CLINICAL DATA: Screening.

EXAM:
DIGITAL SCREENING BILATERAL MAMMOGRAM WITH TOMOSYNTHESIS AND CAD
TECHNIQUE: Bilateral screening digital craniocaudal and mediolateral oblique
mammograms were obtained. Bilateral screening digital breast
tomosynthesis was performed. The images were evaluated with
computer-aided detection.

[R CC synth-2D]
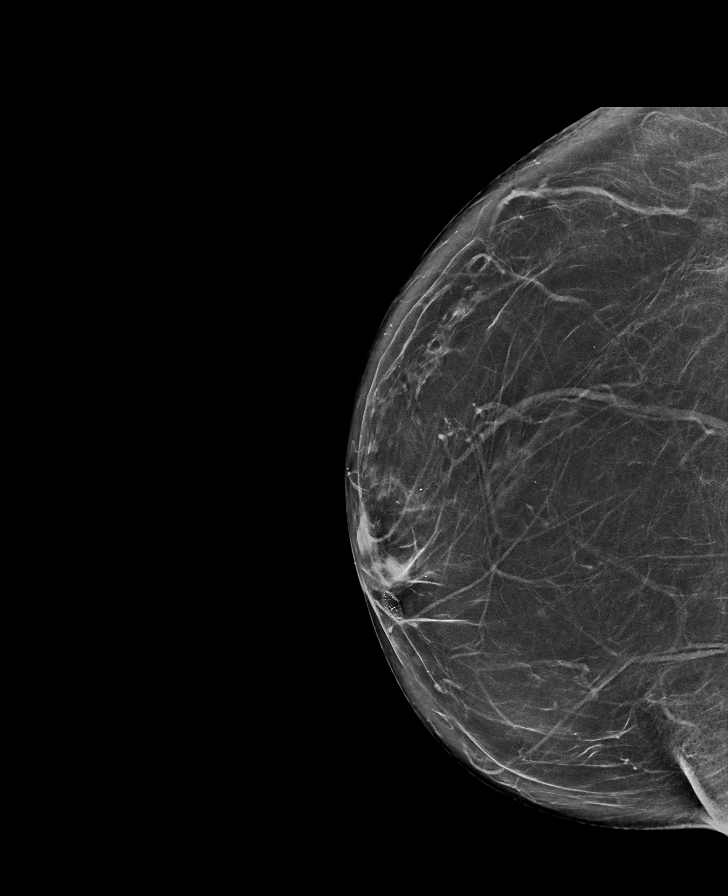

[R MLO synth-2D]
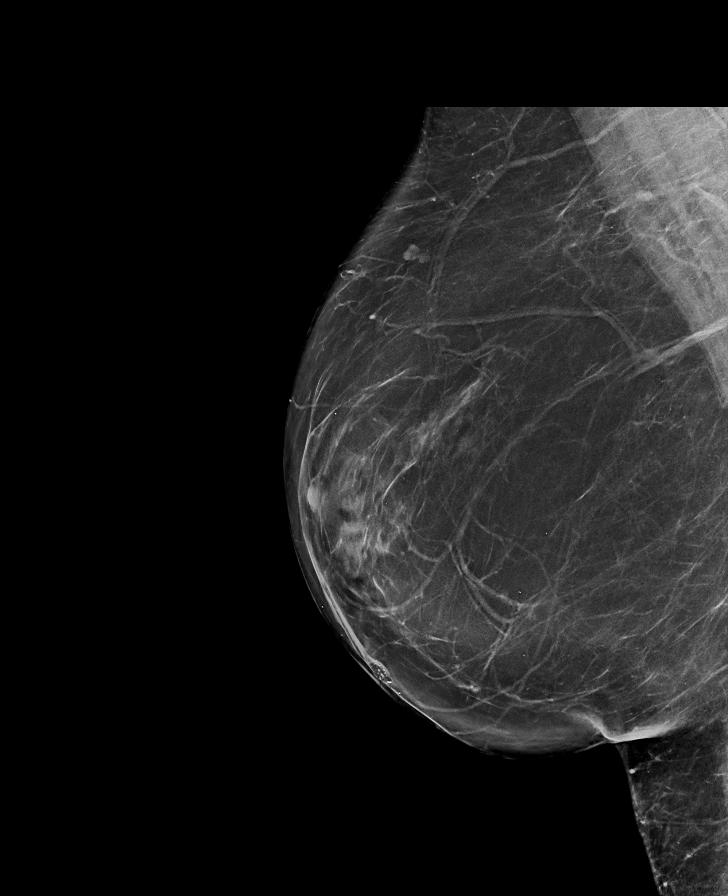

[L MLO synth-2D]
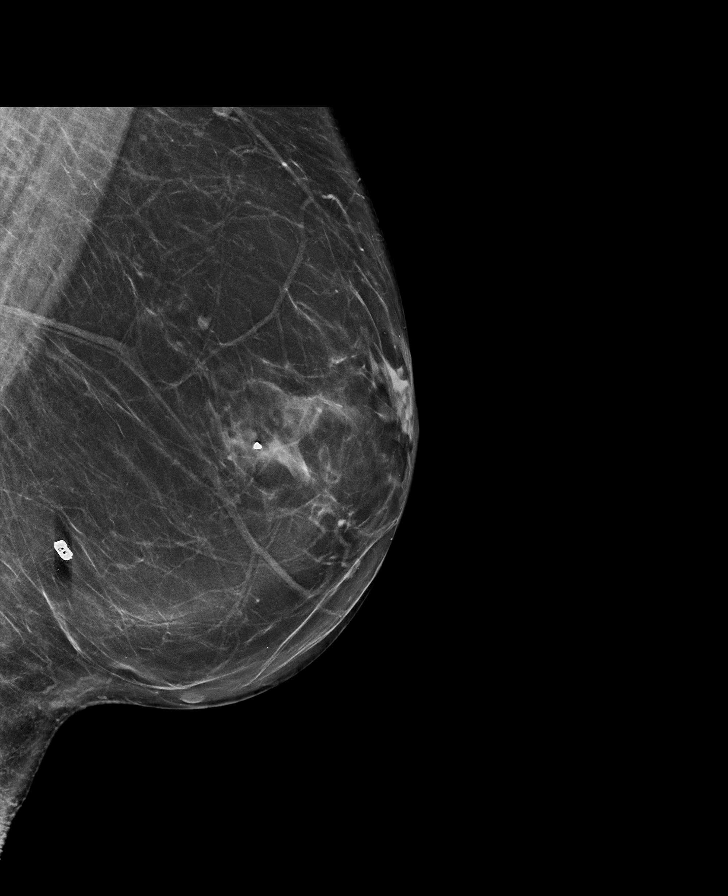

[L CC synth-2D]
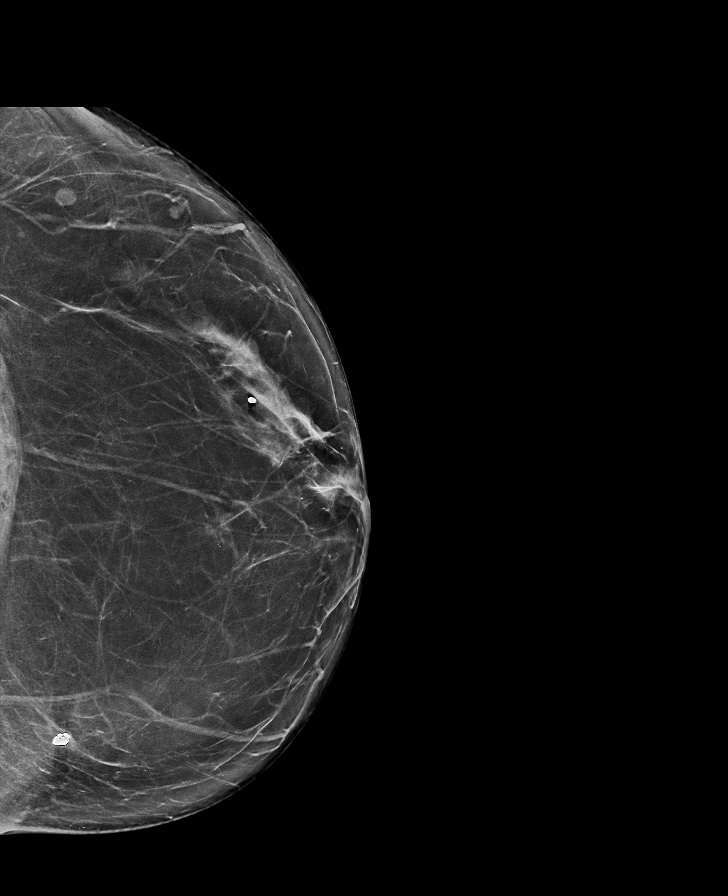

[L MLO tomo · tomo slice 41/82.0]
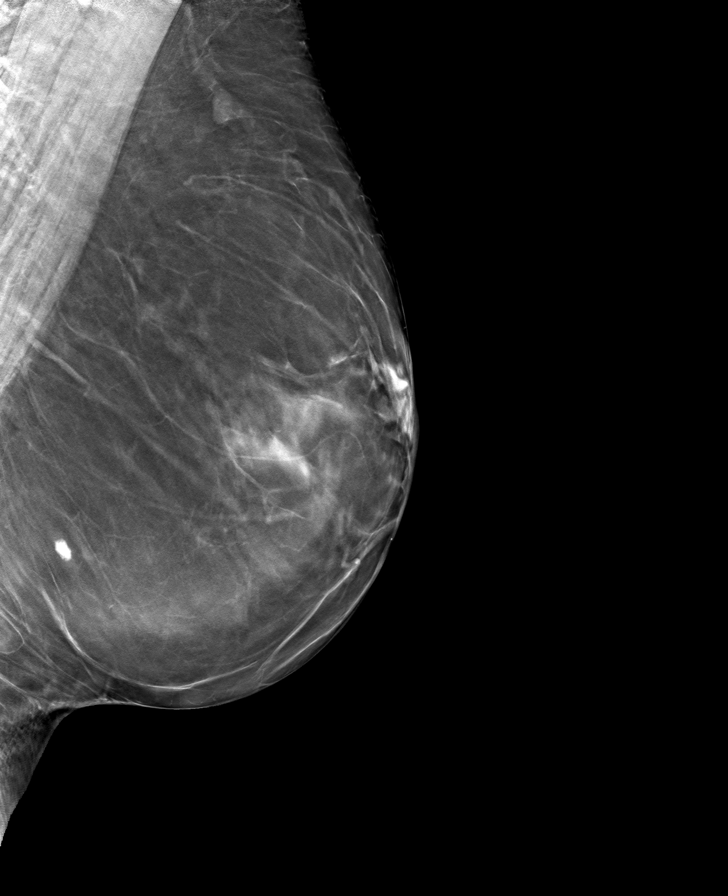

[R CC tomo · tomo slice 39/77.0]
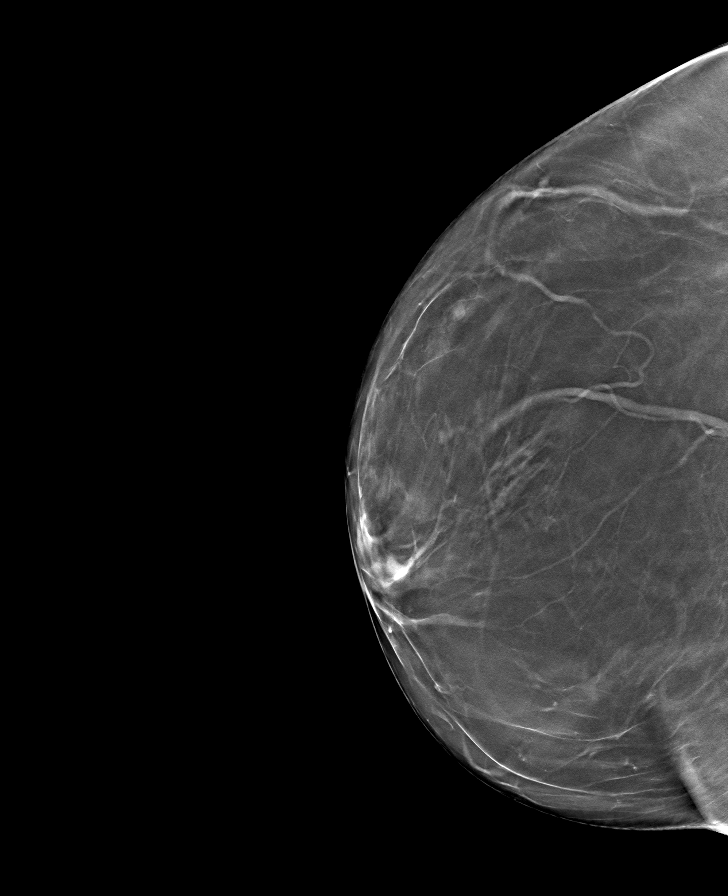

[R MLO tomo · tomo slice 42/83.0]
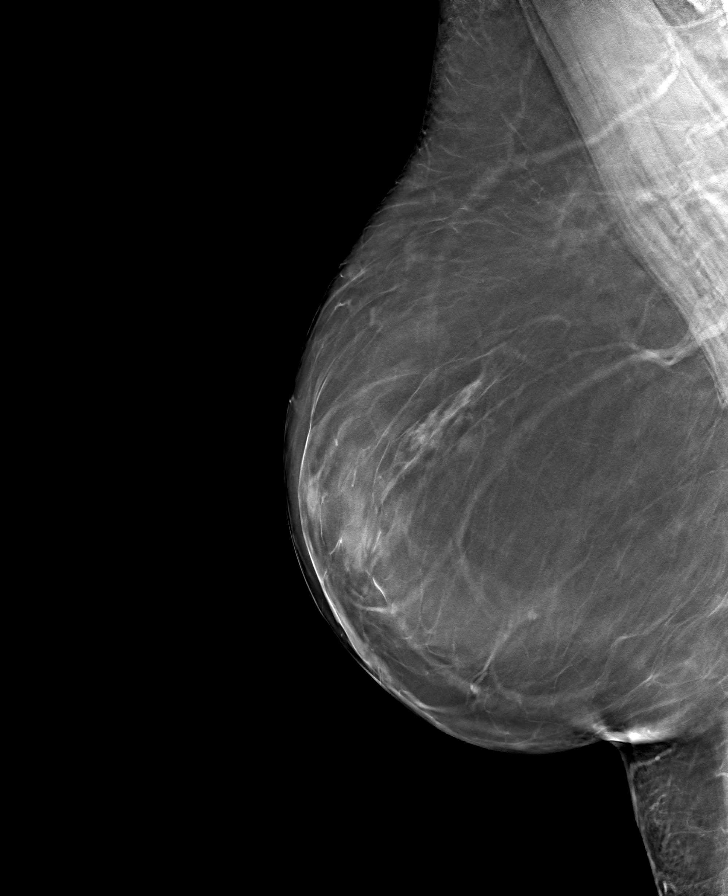

[L CC tomo · tomo slice 41/80.0]
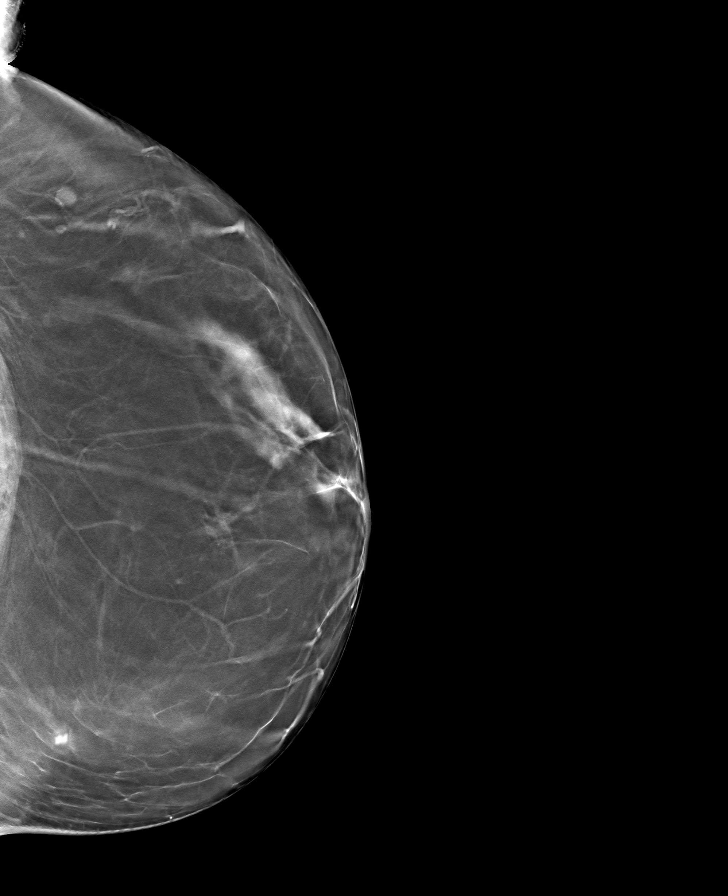

[8 of 24 positions shown; findings below may reference images not displayed]

ACR Breast Density Category b: There are scattered areas of
fibroglandular density.
FINDINGS: There are no findings suspicious for malignancy.
IMPRESSION: No mammographic evidence of malignancy. A result letter of this
screening mammogram will be mailed directly to the patient.

RECOMMENDATION:
Screening mammogram in one year. (Code:51-O-LD2)

BI-RADS CATEGORY  1: Negative.
